# Patient Record
Sex: Male | Born: 1945 | Race: White | Hispanic: No | Smoking: Former smoker
Health system: Southern US, Community
[De-identification: ages and names within clinical notes are randomized; demographics above are authoritative.]

## PROBLEM LIST (undated history)

## (undated) DIAGNOSIS — N200 Calculus of kidney: Secondary | ICD-10-CM

## (undated) DIAGNOSIS — E0590 Thyrotoxicosis, unspecified without thyrotoxic crisis or storm: Secondary | ICD-10-CM

## (undated) DIAGNOSIS — N202 Calculus of kidney with calculus of ureter: Secondary | ICD-10-CM | POA: Insufficient documentation

## (undated) DIAGNOSIS — N201 Calculus of ureter: Secondary | ICD-10-CM

## (undated) DIAGNOSIS — R351 Nocturia: Secondary | ICD-10-CM | POA: Insufficient documentation

## (undated) DIAGNOSIS — N401 Enlarged prostate with lower urinary tract symptoms: Secondary | ICD-10-CM | POA: Insufficient documentation

## (undated) DIAGNOSIS — Z9189 Other specified personal risk factors, not elsewhere classified: Secondary | ICD-10-CM

## (undated) DIAGNOSIS — N281 Cyst of kidney, acquired: Secondary | ICD-10-CM

## (undated) DIAGNOSIS — L719 Rosacea, unspecified: Secondary | ICD-10-CM

## (undated) DIAGNOSIS — G8929 Other chronic pain: Secondary | ICD-10-CM

## (undated) DIAGNOSIS — N1330 Unspecified hydronephrosis: Secondary | ICD-10-CM

## (undated) DIAGNOSIS — E785 Hyperlipidemia, unspecified: Secondary | ICD-10-CM

## (undated) DIAGNOSIS — M545 Low back pain, unspecified: Secondary | ICD-10-CM

## (undated) DIAGNOSIS — I440 Atrioventricular block, first degree: Secondary | ICD-10-CM

## (undated) DIAGNOSIS — R350 Frequency of micturition: Secondary | ICD-10-CM | POA: Insufficient documentation

## (undated) DIAGNOSIS — I10 Essential (primary) hypertension: Secondary | ICD-10-CM

## (undated) DIAGNOSIS — M1990 Unspecified osteoarthritis, unspecified site: Secondary | ICD-10-CM

## (undated) DIAGNOSIS — K5909 Other constipation: Secondary | ICD-10-CM

## (undated) DIAGNOSIS — I214 Non-ST elevation (NSTEMI) myocardial infarction: Secondary | ICD-10-CM

## (undated) HISTORY — DX: Hyperlipidemia, unspecified: E78.5

## (undated) HISTORY — DX: Essential (primary) hypertension: I10

---

## 2002-01-11 ENCOUNTER — Encounter: Payer: Self-pay | Admitting: Orthopedic Surgery

## 2002-01-11 ENCOUNTER — Encounter: Admission: RE | Admit: 2002-01-11 | Discharge: 2002-01-11 | Payer: Self-pay | Admitting: Family Medicine

## 2004-10-16 ENCOUNTER — Ambulatory Visit: Payer: Self-pay | Admitting: Internal Medicine

## 2004-10-16 ENCOUNTER — Inpatient Hospital Stay (HOSPITAL_COMMUNITY): Admission: EM | Admit: 2004-10-16 | Discharge: 2004-10-17 | Payer: Self-pay | Admitting: Emergency Medicine

## 2008-06-29 ENCOUNTER — Emergency Department (HOSPITAL_BASED_OUTPATIENT_CLINIC_OR_DEPARTMENT_OTHER): Admission: EM | Admit: 2008-06-29 | Discharge: 2008-06-29 | Payer: Self-pay | Admitting: Emergency Medicine

## 2008-08-28 ENCOUNTER — Emergency Department (HOSPITAL_BASED_OUTPATIENT_CLINIC_OR_DEPARTMENT_OTHER): Admission: EM | Admit: 2008-08-28 | Discharge: 2008-08-28 | Payer: Self-pay | Admitting: Emergency Medicine

## 2011-02-13 NOTE — Discharge Summary (Signed)
NAME:  Phillip Wong, ALTSCHULER NO.:  [id number]   MEDICAL RECORD NO.:  [id number]          PATIENT TYPE:  INP   LOCATION:  2025                         FACILITY:  [facility]   PHYSICIAN:  Darrol Jump, MD        DATE OF BIRTH:  January 04, 1946   DATE OF ADMISSION:  10/16/2004  DATE OF DISCHARGE:  10/17/2004                                 DISCHARGE SUMMARY   ATTENDING PHYSICIAN:  Duncan Dull, M.D.   RESIDENT:  Dr. Lyda Perone   DISCHARGE DIAGNOSES:  1.  Chest pain.  2.  Hypertension.  3.  Gout.  4.  History of diverticulosis.  5.  History of alcohol abuse.  6.  History of chewing tobacco.  7.  Anxiety.  8.  Hyperlipidemia.   DISCHARGE MEDICATIONS:  1.  Hydrochlorothiazide 25 mg daily.  2.  Cardura 4 mg daily.  3.  Lopressor 100 mg two times a day.  4.  Ibuprofen 800 mg q.8h.  5.  Protonix 40 mg twice daily.   DISPOSITION:  The patient was discharged home to follow up with his primary  care doctor, Dr. Clarene Duke, [facility], within one week.  At that time, he  is going to discuss his hospitalization.   PROCEDURES PERFORMED DURING HOSPITALIZATION:  Nuclear medicine myocardial  perfusion imaging demonstrated no stress-induced ischemia extending to the  apex and to the anterolateral wall.  Ejection fraction was 74%, essentially  normal exam.   BRIEF ADMISSION HISTORY/PHYSICAL:  This is a 65 year old male with a past  medical history significant for hypertension and hyperlipidemia, family  history of coronary artery disease, obesity, who came to the ED with  increasing chest pain over the past week and markedly increased the morning  of admission at 4 a.m. with no associated diaphoresis, nausea, or vomiting.  It did not seem to be exertional.   PHYSICAL EXAMINATION ON ADMISSION:  VITAL SIGNS:  Pulse 87, blood pressure  159/93, temperature 98, respiratory rate 19, saturation 96% on 2 L.  GENERAL:  He was awake and alert, oriented x3.   HEENT:  PERLA, no icterus.   NECK:   No JVD, no thyromegaly.   LUNGS:  Respirations are clear to auscultation bilaterally.   CARDIOVASCULAR:  Regular rate and rhythm with no murmurs, rubs or gallops.   ABDOMEN:  Soft, nontender, nondistended, positive bowel sounds, no  hepatosplenomegaly.   EXTREMITIES:  No cyanosis, clubbing or edema.   NEUROLOGICAL:  Grossly intact.   MUSCULOSKELETAL:  Grossly intact.   PSYCHIATRIC:  The patient appeared really anxious.   LABORATORY DATA:  D-dimer was less than 0.22.  PTT 26.  Point-of-care  markers were negative.  Sodium 138, potassium 4.2, chloride 100, bicarbonate  29, BUN 10, creatinine 1.0, glucose 152, bilirubin 0.8, alkaline phosphatase  81, SGOT 40, SGPT 40, protein was 7, albumin 3.5, calcium 9.2.  White blood  cell 6.4, hemoglobin 14.9, platelets 226,000.  Chest x-ray showed normal x-  ray, no acute abnormality.   HOSPITAL COURSE:  Problem 1.  Chest pain.  The patient was admitted to a  telemetry bed and was started  on heparin drip, nitrates, and beta blocker.  He was given aspirin.  Cardiac enzymes were cycled q.8h. and were negative.  The patient had a nuclear imaging study to rule out ischemia of the  myocardium, which was negative.  The patient was discharged home to follow  up with his primary doctor.  The patient had some diffuse EKG abnormalities  on admission.  He had T wave inversions in V1, V2, V3, as well as aVL and 1.   Problem 2. Hyperlipidemia.  The patient's cholesterol was mildly elevated.  Total cholesterol was 193, triglycerides 170, HDL 47, LDL 112.  The patient  is to discuss the possibility of starting a statin drug with his primary  care physician upon discharge.   Problem 3. Anxiety.  The patient appeared very anxious on admission.  He  states that at home, he has a lot of anxiety.  He has lots of thoughts  racing through his mind.  He has difficulty sleeping at night.  It was  discussed with the patient to bring this up with his primary care  physician  and discuss this with him in the future.   Problem 4. Alcohol abuse.  The patient was started on an Ativan taper,  thiamine, folic acid, and seizure precautions while he was here, and was  discharged home on no Ativan taper, but counseled about the effects of  drinking.  He was not interested in any intervention at this time.   Problem 5. Tobacco abuse.  The patient received tobacco cessation counseling  on this admission, but denied wanting a patch.   LABORATORIES ON DISCHARGE:  White blood cell count 7.2, hemoglobin 14.2,  platelets 203,000.                                               ______________________________  Darrol Jump, MD    SD/MEDQ  D:  10/20/2004  T:  10/20/2004  Job:  [id number]   cc:   Eduardo Osier. Sharyn Lull, M.D.  [address]  [place]  [place] [address]  Fax: [phone]   Anna Genre. Little, M.D.  [address]  [place]  [place] [address]  Fax: [phone]

## 2011-06-30 LAB — URINALYSIS, ROUTINE W REFLEX MICROSCOPIC
Bilirubin Urine: NEGATIVE
Nitrite: NEGATIVE
Specific Gravity, Urine: 1.029
Urobilinogen, UA: 0.2

## 2011-06-30 LAB — URINE MICROSCOPIC-ADD ON

## 2011-06-30 LAB — URINE CULTURE
Colony Count: NO GROWTH
Culture: NO GROWTH

## 2011-09-29 HISTORY — PX: VIDEO ASSISTED THORACOSCOPY (VATS)/DECORTICATION: SHX6171

## 2012-01-28 ENCOUNTER — Ambulatory Visit
Admission: RE | Admit: 2012-01-28 | Discharge: 2012-01-28 | Disposition: A | Discharge status: Home / Self Care | Payer: Medicare Other | Source: Ambulatory Visit | Attending: Cardiology | Admitting: Cardiology

## 2012-01-28 ENCOUNTER — Other Ambulatory Visit: Payer: Self-pay | Admitting: Cardiology

## 2012-01-28 DIAGNOSIS — M79669 Pain in unspecified lower leg: Secondary | ICD-10-CM

## 2012-09-30 ENCOUNTER — Encounter (INDEPENDENT_AMBULATORY_CARE_PROVIDER_SITE_OTHER): Payer: Self-pay | Admitting: Surgery

## 2012-09-30 ENCOUNTER — Ambulatory Visit (INDEPENDENT_AMBULATORY_CARE_PROVIDER_SITE_OTHER): Payer: Medicare Other | Admitting: Surgery

## 2012-09-30 VITALS — BP 118/80 | HR 72 | Temp 97.8°F | Resp 18 | Ht 70.0 in | Wt 204.2 lb

## 2012-09-30 DIAGNOSIS — K429 Umbilical hernia without obstruction or gangrene: Secondary | ICD-10-CM

## 2012-09-30 NOTE — Patient Instructions (Addendum)
Hernia A hernia occurs when an internal organ pushes out through a weak spot in the abdominal wall. Hernias most commonly occur in the groin and around the navel. Hernias often can be pushed back into place (reduced). Most hernias tend to get worse over time. Some abdominal hernias can get stuck in the opening (irreducible or incarcerated hernia) and cannot be reduced. An irreducible abdominal hernia which is tightly squeezed into the opening is at risk for impaired blood supply (strangulated hernia). A strangulated hernia is a medical emergency. Because of the risk for an irreducible or strangulated hernia, surgery may be recommended to repair a hernia. CAUSES   Heavy lifting.  Prolonged coughing.  Straining to have a bowel movement.  A cut (incision) made during an abdominal surgery. HOME CARE INSTRUCTIONS   Bed rest is not required. You may continue your normal activities.  Avoid lifting more than 10 pounds (4.5 kg) or straining.  Cough gently. If you are a smoker it is best to stop. Even the best hernia repair can break down with the continual strain of coughing. Even if you do not have your hernia repaired, a cough will continue to aggravate the problem.  Do not wear anything tight over your hernia. Do not try to keep it in with an outside bandage or truss. These can damage abdominal contents if they are trapped within the hernia sac.  Eat a normal diet.  Avoid constipation. Straining over long periods of time will increase hernia size and encourage breakdown of repairs. If you cannot do this with diet alone, stool softeners may be used. SEEK IMMEDIATE MEDICAL CARE IF:   You have a fever.  You develop increasing abdominal pain.  You feel nauseous or vomit.  Your hernia is stuck outside the abdomen, looks discolored, feels hard, or is tender.  You have any changes in your bowel habits or in the hernia that are unusual for you.  You have increased pain or swelling around the  hernia.  You cannot push the hernia back in place by applying gentle pressure while lying down. MAKE SURE YOU:   Understand these instructions.  Will watch your condition.  Will get help right away if you are not doing well or get worse. Document Released: 09/14/2005 Document Revised: 12/07/2011 Document Reviewed: 05/03/2008 Exit[employer] Patient Information 2013 [employer].  

## 2012-09-30 NOTE — Progress Notes (Signed)
Phillip Wong 67 y.o.  Body mass index is 29.30 kg/(m^2).  Patient Active Problem List  Diagnosis  . Umbilical hernia-small supraumbilical    No Known Allergies  Past Surgical History  Procedure Date  . Lung surgery     for Signa Kell, MD 1. Umbilical hernia-small supraumbilical     [name] had an episode of a sore stomach and was seen by Dr. Clarene Duke who thought that he may have had an incarcerated umbilical hernia. It sounds like he probably has a supraumbilical hernia that was incarcerated with preperitoneal fat. This has been reduced and is not bothering him at the present time. I think he feels like it is not broken don't fix it. I would agree and I told him what to look for in terms of nausea and vomiting should this become more of a problem or get larger. I would be happy to see him again if needed in the future.  Impression small supraumbilical umbilical hernia that is not posing any immediate threat and likely contains preperitoneal fat. Return when necessary Matt B. Daphine Deutscher, MD, [facility] [phone] beeper [phone]  09/30/2012 10:05 AM

## 2013-03-05 ENCOUNTER — Emergency Department (HOSPITAL_COMMUNITY): Payer: Medicare Other

## 2013-03-05 ENCOUNTER — Encounter (HOSPITAL_COMMUNITY): Payer: Self-pay | Admitting: Emergency Medicine

## 2013-03-05 ENCOUNTER — Emergency Department (HOSPITAL_COMMUNITY)
Admission: EM | Admit: 2013-03-05 | Discharge: 2013-03-05 | Disposition: A | Discharge status: Home / Self Care | Payer: Medicare Other | Attending: Emergency Medicine | Admitting: Emergency Medicine

## 2013-03-05 DIAGNOSIS — E785 Hyperlipidemia, unspecified: Secondary | ICD-10-CM | POA: Insufficient documentation

## 2013-03-05 DIAGNOSIS — K5900 Constipation, unspecified: Secondary | ICD-10-CM | POA: Insufficient documentation

## 2013-03-05 DIAGNOSIS — Z87891 Personal history of nicotine dependence: Secondary | ICD-10-CM | POA: Insufficient documentation

## 2013-03-05 DIAGNOSIS — Z8701 Personal history of pneumonia (recurrent): Secondary | ICD-10-CM | POA: Insufficient documentation

## 2013-03-05 DIAGNOSIS — R109 Unspecified abdominal pain: Secondary | ICD-10-CM

## 2013-03-05 DIAGNOSIS — I10 Essential (primary) hypertension: Secondary | ICD-10-CM | POA: Insufficient documentation

## 2013-03-05 DIAGNOSIS — Z79899 Other long term (current) drug therapy: Secondary | ICD-10-CM | POA: Insufficient documentation

## 2013-03-05 LAB — COMPREHENSIVE METABOLIC PANEL
ALT: 20 U/L (ref 0–53)
AST: 31 U/L (ref 0–37)
CO2: 25 mEq/L (ref 19–32)
Calcium: 9.7 mg/dL (ref 8.4–10.5)
Sodium: 135 mEq/L (ref 135–145)
Total Protein: 8.2 g/dL (ref 6.0–8.3)

## 2013-03-05 LAB — CBC WITH DIFFERENTIAL/PLATELET
Basophils Absolute: 0.1 10*3/uL (ref 0.0–0.1)
Eosinophils Absolute: 0.1 10*3/uL (ref 0.0–0.7)
Eosinophils Relative: 1 % (ref 0–5)
Lymphocytes Relative: 38 % (ref 12–46)
MCV: 89 fL (ref 78.0–100.0)
Platelets: 208 10*3/uL (ref 150–400)
RDW: 13.2 % (ref 11.5–15.5)
WBC: 7.7 10*3/uL (ref 4.0–10.5)

## 2013-03-05 LAB — URINALYSIS, ROUTINE W REFLEX MICROSCOPIC
Bilirubin Urine: NEGATIVE
Glucose, UA: NEGATIVE mg/dL
Specific Gravity, Urine: 1.029 (ref 1.005–1.030)
pH: 5.5 (ref 5.0–8.0)

## 2013-03-05 LAB — URINE MICROSCOPIC-ADD ON

## 2013-03-05 MED ORDER — IOHEXOL 300 MG/ML  SOLN
100.0000 mL | Freq: Once | INTRAMUSCULAR | Status: AC | PRN
  Administered 2013-03-05: 100 mL via INTRAVENOUS

## 2013-03-05 MED ORDER — IOHEXOL 300 MG/ML  SOLN
50.0000 mL | Freq: Once | INTRAMUSCULAR | Status: AC | PRN
  Administered 2013-03-05: 50 mL via ORAL

## 2013-03-05 NOTE — ED Notes (Signed)
Patient presents to ED today with complaints of right middle abdominal pain for the past month.

## 2013-03-05 NOTE — ED Provider Notes (Signed)
History     CSN: [id number]  Arrival date & time 03/05/13  1308   First MD Initiated Contact with Patient 03/05/13 1924      Chief Complaint  Patient presents with  . Abdominal Pain    (Consider location/radiation/quality/duration/timing/severity/associated sxs/prior treatment) Patient is a 67 y.o. male presenting with abdominal pain. The history is provided by the patient.  Abdominal Pain This is a new problem. The current episode started more than 1 month ago (2 months ago). The problem occurs intermittently. The problem has been gradually worsening. Associated symptoms include abdominal pain. Pertinent negatives include no chest pain, chills, congestion, coughing, fever, headaches, nausea, vomiting or weakness. Nothing aggravates the symptoms. Treatments tried: stool softener, medicine 'for gas" from PCP. The treatment provided no relief.    Past Medical History  Diagnosis Date  . Hypertension   . Hyperlipidemia   . History of pneumonia     Past Surgical History  Procedure Laterality Date  . Lung surgery      for pnuemonia    Family History  Problem Relation Age of Onset  . Heart disease Mother     History  Substance Use Topics  . Smoking status: Former Smoker    Quit date: 09/28/1998  . Smokeless tobacco: Not on file  . Alcohol Use: Yes      Review of Systems  Constitutional: Negative for fever and chills.  HENT: Negative for congestion and rhinorrhea.   Respiratory: Negative for cough, chest tightness and shortness of breath.   Cardiovascular: Negative for chest pain.  Gastrointestinal: Positive for abdominal pain. Negative for nausea, vomiting, diarrhea, constipation, blood in stool and abdominal distention.  Genitourinary: Negative for dysuria.  Musculoskeletal: Negative for back pain.  Neurological: Negative for dizziness, weakness and headaches.  All other systems reviewed and are negative.    Allergies  Review of patient's allergies indicates no  known allergies.  Home Medications   Current Outpatient Rx  Name  Route  Sig  Dispense  Refill  . amLODipine (NORVASC) 5 MG tablet   Oral   Take 5 mg by mouth daily.          . carvedilol (COREG) 12.5 MG tablet      daily.         . cyclobenzaprine (FLEXERIL) 10 MG tablet   Oral   Take 10 mg by mouth daily.         . finasteride (PROSCAR) 5 MG tablet   Oral   Take 5 mg by mouth daily.          . hydrochlorothiazide (HYDRODIURIL) 25 MG tablet   Oral   Take 25 mg by mouth daily.          Marland Kitchen HYDROcodone-acetaminophen (NORCO) 7.5-325 MG per tablet   Oral   Take 1 tablet by mouth every 6 (six) hours as needed for pain.          Marland Kitchen lisinopril (PRINIVIL,ZESTRIL) 10 MG tablet   Oral   Take 10 mg by mouth daily.          . Melatonin 5 MG TABS   Oral   Take 1 tablet by mouth at bedtime.         . polyethylene glycol (MIRALAX / GLYCOLAX) packet   Oral   Take 17 g by mouth daily.         . pravastatin (PRAVACHOL) 40 MG tablet   Oral   Take 40 mg by mouth daily.          Marland Kitchen  propylthiouracil (PTU) 50 MG tablet   Oral   Take 50 mg by mouth 2 (two) times daily.          . temazepam (RESTORIL) 30 MG capsule   Oral   Take 30 mg by mouth at bedtime as needed for sleep.            BP 146/77  Pulse 82  Temp(Src) 97.9 F (36.6 C) (Oral)  Resp 17  Ht 5\' 10"  (1.778 m)  Wt 198 lb (89.812 kg)  BMI 28.41 kg/m2  SpO2 97%  Physical Exam  Nursing note and vitals reviewed. Constitutional: He is oriented to person, place, and time. He appears well-developed and well-nourished. No distress.  HENT:  Head: Normocephalic and atraumatic.  Right Ear: External ear normal.  Left Ear: External ear normal.  Mouth/Throat: Oropharynx is clear and moist.  Eyes: Pupils are equal, round, and reactive to light.  Neck: Normal range of motion. Neck supple.  Cardiovascular: Normal rate, regular rhythm, normal heart sounds and intact distal pulses.  Exam reveals no gallop  and no friction rub.   No murmur heard. Pulmonary/Chest: Effort normal and breath sounds normal. No respiratory distress. He has no wheezes. He has no rales.  Abdominal: Soft. There is tenderness (mild TTP RLQ). There is no rebound and no guarding.  No evidence of inguinal or femoral hernia. No CVA TTP  Musculoskeletal: Normal range of motion. He exhibits no edema and no tenderness.  Lymphadenopathy:    He has no cervical adenopathy.  Neurological: He is alert and oriented to person, place, and time.  Skin: Skin is warm and dry. No rash noted. No erythema.  Psychiatric: He has a normal mood and affect. His behavior is normal.    ED Course  Procedures (including critical care time)  Labs Reviewed  CBC WITH DIFFERENTIAL - Abnormal; Notable for the following:    MCHC 37.4 (*)    All other components within normal limits  COMPREHENSIVE METABOLIC PANEL - Abnormal; Notable for the following:    Glucose, Bld 115 (*)    Alkaline Phosphatase 142 (*)    All other components within normal limits  URINALYSIS, ROUTINE W REFLEX MICROSCOPIC - Abnormal; Notable for the following:    APPearance CLOUDY (*)    Hgb urine dipstick LARGE (*)    Protein, ur 100 (*)    Leukocytes, UA SMALL (*)    All other components within normal limits  URINE MICROSCOPIC-ADD ON   Ct Abdomen Pelvis W Contrast  03/05/2013   *RADIOLOGY REPORT*  Clinical Data: Right-sided abdominal pain for 2 months.  Pain and constipation.  CT ABDOMEN AND PELVIS WITH CONTRAST  Technique:  Multidetector CT imaging of the abdomen and pelvis was performed following the standard protocol during bolus administration of intravenous contrast.  Contrast: OMNIPAQUE IOHEXOL 300 MG/ML  SOLN  Comparison: 05/06/2011  Findings: Fibrosis and atelectasis in the lung bases.  Changes consistent with chronic lung disease.  Coronary artery calcifications.  The gallbladder is contracted to a which may be physiologic.  No stones or inflammatory infiltration  demonstrated. Large bilateral stones in the renal pelvis, the right measuring 1.2 x 2.2 cm and the left measuring 1.3 x 0.8 cm.  These changes appear stable since previous study.  No evidence of pyelocaliectasis or ureterectasis. No ureteral stones or bladder stones identified.  Symmetrical renal nephrograms.  The liver, spleen, pancreas, adrenal glands, inferior vena cava, and retroperitoneal lymph nodes are unremarkable.  Calcification of the aorta without aneurysm.  Scattered celiac axis lymph nodes are not pathologically enlarged.  The stomach, small bowel, and colon are not abnormally distended and no wall thickening is identified. Stool fills the colon.  No free air or free fluid in the abdomen. Abdominal wall appears intact.  Pelvis:  Calcification in the prostate without significant enlargement.  Bladder wall is not thickened.  No free or loculated pelvic fluid collections.  No significant pelvic lymphadenopathy. No evidence of diverticulitis.  The appendix is not identified. Degenerative changes in the lumbar spine.  No destructive bone lesions appreciated.  IMPRESSION: Stable appearance of large bilateral stones in the renal pelvis. Nonspecific contraction of the gallbladder may be physiologic.  No acute inflammatory process demonstrated in the abdomen or pelvis.   Original Report Authenticated By: Burman Nieves, M.D.     1. Abdominal pain   2. Constipation       MDM  82:50 PM 67 year old male with history of hypertension hyperlipidemia presenting with 2 months of intermittent abdominal pain this gradually worsened. Patient has seen his PCP twice, first time being given stool softener and second time being given "something for gas". He denies any improvement in symptoms of his medicines. He denies nausea, vomiting, fever, chest pain or shortness of breath. He denies constipation or change in caliber of stools. No bloody stools. Denies ever having colonoscopy in the past. Abdomen mildly  tenderness right lower quadrant but no guarding or rebound. No evidence of hernias on exam. Vitals are stable. Overall comfortable. We'll check labs and given a CT scan abdomen and pelvis.  10:22 PM labs with no significant abnormality. CT neg for acute process but does show stool burden. Discussed with patient. Stool could be contributing. He did recently decrease his dose of miralax so told to increase back up. He will f/u with his PCP for re-eval and if still having symptoms discuss referral to GI. He voiced understanding, given return precautions and dc'd home in stable condition.       Caren Hazy, MD 03/05/13 [id number]

## 2013-03-05 NOTE — ED Provider Notes (Signed)
I saw and evaluated the patient, reviewed the resident's note and I agree with the findings and plan.   Lavi Sheehan D Mehul Rudin, MD 03/05/13 2347 

## 2013-03-05 NOTE — ED Notes (Signed)
Pt states understanding of discharge instructions 

## 2013-03-05 NOTE — ED Notes (Signed)
Johnnell MD at bedside. 

## 2013-03-05 NOTE — ED Provider Notes (Signed)
67 year old male with approximately 2 months of intermittent but more frequently occurring right lower quadrant abdominal pain. He has been seen by his family Dr. twice, prescribed MiraLAX to help with hard stools but this has not seemed to relieve his pain. On exam the patient is in no distress, he is distracted by the television, soft and nontender abdomen with no guarding, no masses, no peritoneal signs. Heart and lungs appear clear, no significant peripheral edema, oropharynx is clear and moist and the patient has been able to tolerate a normal diet.  Laboratory workup to ensue, the patient would benefit from further imaging as he may have a slow-growing or indolent process that needs attention such as a tumor or a mass. Other than that the patient is hemodynamically stable and should be safe for discharge CT scan pending.  I agree with the documentation and medical decision-making of Dr. Fayrene Fearing.  Vida Roller, MD 03/05/13 2025

## 2013-05-03 ENCOUNTER — Other Ambulatory Visit: Payer: Self-pay

## 2013-08-03 ENCOUNTER — Other Ambulatory Visit: Payer: Self-pay

## 2014-06-21 ENCOUNTER — Emergency Department (HOSPITAL_COMMUNITY)
Admission: EM | Admit: 2014-06-21 | Discharge: 2014-06-21 | Disposition: A | Discharge status: Home / Self Care | Payer: Medicare Other | Attending: Emergency Medicine | Admitting: Emergency Medicine

## 2014-06-21 ENCOUNTER — Emergency Department (HOSPITAL_COMMUNITY): Payer: Medicare Other

## 2014-06-21 ENCOUNTER — Encounter (HOSPITAL_COMMUNITY): Payer: Self-pay | Admitting: Emergency Medicine

## 2014-06-21 DIAGNOSIS — R109 Unspecified abdominal pain: Secondary | ICD-10-CM | POA: Diagnosis present

## 2014-06-21 DIAGNOSIS — E785 Hyperlipidemia, unspecified: Secondary | ICD-10-CM | POA: Diagnosis not present

## 2014-06-21 DIAGNOSIS — R1031 Right lower quadrant pain: Secondary | ICD-10-CM | POA: Insufficient documentation

## 2014-06-21 DIAGNOSIS — Z8701 Personal history of pneumonia (recurrent): Secondary | ICD-10-CM | POA: Diagnosis not present

## 2014-06-21 DIAGNOSIS — Z79899 Other long term (current) drug therapy: Secondary | ICD-10-CM | POA: Insufficient documentation

## 2014-06-21 DIAGNOSIS — Z87891 Personal history of nicotine dependence: Secondary | ICD-10-CM | POA: Diagnosis not present

## 2014-06-21 DIAGNOSIS — K6289 Other specified diseases of anus and rectum: Secondary | ICD-10-CM | POA: Insufficient documentation

## 2014-06-21 DIAGNOSIS — I10 Essential (primary) hypertension: Secondary | ICD-10-CM | POA: Insufficient documentation

## 2014-06-21 DIAGNOSIS — K648 Other hemorrhoids: Secondary | ICD-10-CM | POA: Insufficient documentation

## 2014-06-21 DIAGNOSIS — R1011 Right upper quadrant pain: Secondary | ICD-10-CM | POA: Insufficient documentation

## 2014-06-21 LAB — COMPREHENSIVE METABOLIC PANEL
ALT: 22 U/L (ref 0–53)
AST: 21 U/L (ref 0–37)
Albumin: 4.1 g/dL (ref 3.5–5.2)
Alkaline Phosphatase: 81 U/L (ref 39–117)
Anion gap: 14 (ref 5–15)
BUN: 16 mg/dL (ref 6–23)
CALCIUM: 9.8 mg/dL (ref 8.4–10.5)
CO2: 24 meq/L (ref 19–32)
CREATININE: 0.6 mg/dL (ref 0.50–1.35)
Chloride: 99 mEq/L (ref 96–112)
GLUCOSE: 137 mg/dL — AB (ref 70–99)
Potassium: 3.8 mEq/L (ref 3.7–5.3)
Sodium: 137 mEq/L (ref 137–147)
Total Bilirubin: 1.1 mg/dL (ref 0.3–1.2)
Total Protein: 8 g/dL (ref 6.0–8.3)

## 2014-06-21 LAB — CBC WITH DIFFERENTIAL/PLATELET
Basophils Absolute: 0 10*3/uL (ref 0.0–0.1)
Basophils Relative: 1 % (ref 0–1)
EOS ABS: 0 10*3/uL (ref 0.0–0.7)
Eosinophils Relative: 0 % (ref 0–5)
HCT: 43.4 % (ref 39.0–52.0)
Hemoglobin: 15.2 g/dL (ref 13.0–17.0)
LYMPHS ABS: 1.9 10*3/uL (ref 0.7–4.0)
LYMPHS PCT: 27 % (ref 12–46)
MCH: 31.6 pg (ref 26.0–34.0)
MCHC: 35 g/dL (ref 30.0–36.0)
MCV: 90.2 fL (ref 78.0–100.0)
Monocytes Absolute: 0.3 10*3/uL (ref 0.1–1.0)
Monocytes Relative: 4 % (ref 3–12)
NEUTROS ABS: 4.8 10*3/uL (ref 1.7–7.7)
NEUTROS PCT: 68 % (ref 43–77)
PLATELETS: 223 10*3/uL (ref 150–400)
RBC: 4.81 MIL/uL (ref 4.22–5.81)
RDW: 13.2 % (ref 11.5–15.5)
WBC: 7 10*3/uL (ref 4.0–10.5)

## 2014-06-21 LAB — URINALYSIS, ROUTINE W REFLEX MICROSCOPIC
Bilirubin Urine: NEGATIVE
GLUCOSE, UA: NEGATIVE mg/dL
Ketones, ur: NEGATIVE mg/dL
Nitrite: NEGATIVE
PH: 5 (ref 5.0–8.0)
PROTEIN: 30 mg/dL — AB
SPECIFIC GRAVITY, URINE: 1.025 (ref 1.005–1.030)
Urobilinogen, UA: 0.2 mg/dL (ref 0.0–1.0)

## 2014-06-21 LAB — URINE MICROSCOPIC-ADD ON

## 2014-06-21 LAB — POC OCCULT BLOOD, ED: Fecal Occult Bld: NEGATIVE

## 2014-06-21 MED ORDER — IOHEXOL 300 MG/ML  SOLN
100.0000 mL | Freq: Once | INTRAMUSCULAR | Status: AC | PRN
  Administered 2014-06-21: 100 mL via INTRAVENOUS

## 2014-06-21 MED ORDER — HYDROCORTISONE ACETATE 25 MG RE SUPP: 25.0000 mg | Freq: Two times a day (BID) | RECTAL | Status: DC

## 2014-06-21 MED ORDER — IOHEXOL 300 MG/ML  SOLN: 25.0000 mL | Freq: Once | INTRAMUSCULAR | Status: DC | PRN

## 2014-06-21 MED ORDER — SODIUM CHLORIDE 0.9 % IV BOLUS (SEPSIS)
1000.0000 mL | Freq: Once | INTRAVENOUS | Status: AC
  Administered 2014-06-21: 1000 mL via INTRAVENOUS

## 2014-06-21 MED ORDER — MORPHINE SULFATE 4 MG/ML IJ SOLN: 4.0000 mg | Freq: Once | INTRAMUSCULAR | Status: DC

## 2014-06-21 NOTE — ED Provider Notes (Signed)
CSN: [id number]     Arrival date & time 06/21/14  0957 History   First MD Initiated Contact with Patient 06/21/14 1631     Chief Complaint  Patient presents with  . Abdominal Pain  . Rectal Pain     (Consider location/radiation/quality/duration/timing/severity/associated sxs/prior Treatment) HPI Phillip Wong is a 68 year old male with past medical history of hypertension, hyperlipidemia who presents to the ER with abdominal pain for 3 weeks. Patient states his pain began gradually approximately 3 weeks ago, has been constant, his only alleviated with pain medicine. Patient states he noticed some mild aggravation of it when he is reaching out work with a heavy object such as hedge clippers when doing yard work. Patient reports a burning sensation in his rectum after he passes a bowel movement which is consistent with previous hemorrhoids and states that his stools have been consistently loose, however he takes MiraLAX daily. Patient denies any associated nausea, vomiting, lightheadedness, dizziness, weakness, shortness of breath, chest pain, hematochezia, melena, dysuria. Patient went to see his primary care physician on 06/06/2014 for same. Patient's PCP took blood work at that time, and noted patient's TSH to be low. Patient states since that time, his pain has been constant, and has only been alleviated with hydrocodone. Patient states he was seen in the ER for similar complaints last year and 02/2013. Patient had a CT abdomen pelvis and 02/2013 which was only remarkable for some large stones and patient's renal pelvis bilaterally. Patient states last month he also noted a 9 pound weight loss which is also what his PCP was working him up for on 06/06/14.  Past Medical History  Diagnosis Date  . Hypertension   . Hyperlipidemia   . History of pneumonia    Past Surgical History  Procedure Laterality Date  . Lung surgery      for pnuemonia   Family History  Problem Relation Age of Onset  . Heart  disease Mother    History  Substance Use Topics  . Smoking status: Former Smoker    Quit date: 09/28/1998  . Smokeless tobacco: Not on file  . Alcohol Use: Yes    Review of Systems  Constitutional: Negative for fever and fatigue.  HENT: Negative for trouble swallowing.   Eyes: Negative for visual disturbance.  Respiratory: Negative for shortness of breath.   Cardiovascular: Negative for chest pain.  Gastrointestinal: Positive for abdominal pain. Negative for nausea and vomiting.  Genitourinary: Negative for dysuria.  Musculoskeletal: Negative for neck pain.  Skin: Negative for rash.  Neurological: Negative for dizziness, weakness and numbness.  Psychiatric/Behavioral: Negative.       Allergies  Review of patient's allergies indicates no known allergies.  Home Medications   Prior to Admission medications   Medication Sig Start Date End Date Taking? Authorizing Provider  allopurinol (ZYLOPRIM) 300 MG tablet Take 300 mg by mouth daily.   Yes Historical Provider, MD  amLODipine (NORVASC) 5 MG tablet Take 5 mg by mouth daily.  09/26/12  Yes Historical Provider, MD  carvedilol (COREG) 12.5 MG tablet Take 12.5 mg by mouth 2 (two) times daily with a meal.  08/29/12  Yes Historical Provider, MD  finasteride (PROSCAR) 5 MG tablet Take 5 mg by mouth daily.  09/15/12  Yes Historical Provider, MD  hydrochlorothiazide (HYDRODIURIL) 25 MG tablet Take 25 mg by mouth daily.  09/26/12  Yes Historical Provider, MD  HYDROcodone-acetaminophen (NORCO) 7.5-325 MG per tablet Take 1 tablet by mouth every 6 (six) hours as needed for pain.  08/18/12  Yes Historical Provider, MD  lisinopril (PRINIVIL,ZESTRIL) 10 MG tablet Take 10 mg by mouth daily.  09/26/12  Yes Historical Provider, MD  polyethylene glycol (MIRALAX / GLYCOLAX) packet Take 17 g by mouth daily.   Yes Historical Provider, MD  pravastatin (PRAVACHOL) 40 MG tablet Take 40 mg by mouth daily.  08/29/12  Yes Historical Provider, MD   propylthiouracil (PTU) 50 MG tablet Take 50 mg by mouth 2 (two) times daily.  09/15/12  Yes Historical Provider, MD  temazepam (RESTORIL) 30 MG capsule Take 30 mg by mouth at bedtime as needed for sleep.  09/14/12  Yes Historical Provider, MD  hydrocortisone (ANUSOL-HC) 25 MG suppository Place 1 suppository (25 mg total) rectally 2 (two) times daily. 06/21/14   Monte Fantasia, PA-C   BP 120/86  Pulse 78  Temp(Src) 98.1 F (36.7 C) (Oral)  Resp 16  SpO2 98% Physical Exam  Nursing note and vitals reviewed. Constitutional: He is oriented to person, place, and time. He appears well-developed and well-nourished. No distress.  HENT:  Head: Normocephalic and atraumatic.  Mouth/Throat: Oropharynx is clear and moist. No oropharyngeal exudate.  Eyes: Right eye exhibits no discharge. Left eye exhibits no discharge. No scleral icterus.  Neck: Normal range of motion.  Cardiovascular: Normal rate, regular rhythm and normal heart sounds.   No murmur heard. Pulmonary/Chest: Effort normal and breath sounds normal. No respiratory distress.  Abdominal: Soft. Normal appearance and bowel sounds are normal. There is no tenderness. Hernia confirmed negative in the right inguinal area and confirmed negative in the left inguinal area.  Patient references his pain in the right side of his abdomen diffusely. No point tenderness, abdomen soft, nontender.  Genitourinary: Prostate normal, testes normal and penis normal. Rectal exam shows internal hemorrhoid. Rectal exam shows no external hemorrhoid, no fissure, no mass, no tenderness and anal tone normal. Guaiac negative stool. Prostate is not enlarged and not tender. Cremasteric reflex is present. Right testis shows no mass, no swelling and no tenderness. Right testis is descended. Cremasteric reflex is not absent on the right side. Left testis shows no mass, no swelling and no tenderness. Left testis is descended. Cremasteric reflex is not absent on the left side.  Circumcised. No phimosis, paraphimosis, hypospadias, penile erythema or penile tenderness. No discharge found.  Internal hemorrhoid noted at 3:00 position. Mild discomfort noted during rectal exam. Chaperone present during entire genital exam.  Musculoskeletal: Normal range of motion. He exhibits no edema and no tenderness.  Lymphadenopathy:       Right: No inguinal adenopathy present.       Left: No inguinal adenopathy present.  Neurological: He is alert and oriented to person, place, and time. No cranial nerve deficit. Coordination normal.  Skin: Skin is warm and dry. No rash noted. He is not diaphoretic.  Psychiatric: He has a normal mood and affect.    ED Course  Procedures (including critical care time) Labs Review Labs Reviewed  COMPREHENSIVE METABOLIC PANEL - Abnormal; Notable for the following:    Glucose, Bld 137 (*)    All other components within normal limits  URINALYSIS, ROUTINE W REFLEX MICROSCOPIC - Abnormal; Notable for the following:    APPearance CLOUDY (*)    Hgb urine dipstick LARGE (*)    Protein, ur 30 (*)    Leukocytes, UA SMALL (*)    All other components within normal limits  CBC WITH DIFFERENTIAL  URINE MICROSCOPIC-ADD ON  POC OCCULT BLOOD, ED    Imaging Review Ct  Abdomen Pelvis W Contrast  06/21/2014   CLINICAL DATA:  Right-sided abdominal pain.  EXAM: CT ABDOMEN AND PELVIS WITH CONTRAST  TECHNIQUE: Multidetector CT imaging of the abdomen and pelvis was performed using the standard protocol following bolus administration of intravenous contrast.  CONTRAST:  OMNIPAQUE IOHEXOL 300 MG/ML  SOLN  COMPARISON:  CT 03/05/2013  FINDINGS: Visualization of the lower thorax demonstrates dependent atelectasis. No pleural effusion. Normal heart size. Fracture vascular calcifications.  Liver is normal in size and contour. Regional hypoattenuation within the posterior right hepatic lobe is favored to represent fatty deposition. There is an 8 mm flash filling lesion  within the right hepatic lobe favored to represent a small hemangioma. The portal vein is patent. No intrahepatic or extrahepatic biliary ductal dilatation. The spleen, pancreas and bilateral adrenal glands are unremarkable. There is a small duodenal diverticulum.  The kidneys enhance symmetrically with contrast. Re- demonstrated 2.1 cm stone within the right renal collecting system. Re- demonstrated 1.4 cm stone within the left renal collecting system. Simple cysts within the interpolar region of the left kidney.  Normal caliber abdominal aorta with scattered calcified atherosclerotic plaque. No retroperitoneal lymphadenopathy. Central dystrophic calcifications in the prostate. Small fat containing right inguinal hernia.  Normal appendix. No abnormal bowel wall thickening or evidence for bowel obstruction. No free fluid or free intraperitoneal air. Lower lumbar spine degenerative changes. No aggressive or acute appearing osseous lesions.  IMPRESSION: 1. Re- demonstrated large bilateral stones within the left and right renal pelvis. 2. No acute process within the abdomen or pelvis.   Electronically Signed   By: Annia Belt M.D.   On: 06/21/2014 19:59     EKG Interpretation None      MDM   Final diagnoses:  Right upper quadrant pain  Right lower quadrant abdominal pain   67 year old male with 3 weeks of right-sided abdominal pain. Patient seen by PCP for abdominal pain and weight loss, and noted to have low TSH, managed in the office. Patient stating his pain has been consistent, and is not getting any better. Patient states he has never had a pain similar to this in the past. Patient states his pain is worse with sitting up, alleviated by standing up, or lying down. Patient states he was recently trimming hedges, with an electric hedge clipper, after which he noticed the pain was worse that evening. Workup for rule out of acute abdomen with CBC, UA, CMP, CT abdomen pelvis. Patient also complaining of  "burning sensation" while defecating. Patient noted to have an internal hemorrhoid on rectal exam. Fecal occult blood negative.   Patient is nontoxic, nonseptic appearing, in no apparent distress. No leukocytosis, or abnormal electrolyte levels. Liver function within normal limits. UA remarkable for hematuria. Patient states this is baseline for him, he is seeing urology for same, and it is an ongoing issue, however urologist states it is stable.  Patient's pain and other symptoms adequately managed in emergency department.  Fluid bolus given.  Labs, imaging and vitals reviewed.  Patient does not meet the SIRS or Sepsis criteria.  On repeat exam patient does not have a surgical abdomin and there are no peritoneal signs.  No indication of appendicitis, bowel obstruction, bowel perforation, cholecystitis, diverticulitis.  Patient discharged home with symptomatic treatment and given strict instructions for follow-up with their primary care physician. I also gave patient Anusol suppository to help his hemorrhoid pain.  I have also discussed reasons to return immediately to the ER.  Patient expresses understanding and  agrees with plan. I strongly encouraged patient to follow up with [facility] gastroenterology for this pain as he has seen in the past for colonoscopy.  BP 120/86  Pulse 78  Temp(Src) 98.1 F (36.7 C) (Oral)  Resp 16  SpO2 98%   Signed,  Ladona Mow, PA-C 12:33 AM   This patient seen and discussed with Dr. Jerelyn Scott, MD      Monte Fantasia, PA-C 06/22/14 0033  Monte Fantasia, PA-C 06/22/14 [phone]

## 2014-06-21 NOTE — Discharge Instructions (Signed)
Followup with primary care physician and gastroenterologist. Return to the ER if he develops any nausea, vomiting, worsening abdominal pain, fever, diarrhea, chest pain, shortness of breath, trouble urinating.  Abdominal Pain Many things can cause abdominal pain. Usually, abdominal pain is not caused by a disease and will improve without treatment. It can often be observed and treated at home. Your health care provider will do a physical exam and possibly order blood tests and X-rays to help determine the seriousness of your pain. However, in many cases, more time must pass before a clear cause of the pain can be found. Before that point, your health care provider may not know if you need more testing or further treatment. HOME CARE INSTRUCTIONS  Monitor your abdominal pain for any changes. The following actions may help to alleviate any discomfort you are experiencing:  Only take over-the-counter or prescription medicines as directed by your health care provider.  Do not take laxatives unless directed to do so by your health care provider.  Try a clear liquid diet (broth, tea, or water) as directed by your health care provider. Slowly move to a bland diet as tolerated. SEEK MEDICAL CARE IF:  You have unexplained abdominal pain.  You have abdominal pain associated with nausea or diarrhea.  You have pain when you urinate or have a bowel movement.  You experience abdominal pain that wakes you in the night.  You have abdominal pain that is worsened or improved by eating food.  You have abdominal pain that is worsened with eating fatty foods.  You have a fever. SEEK IMMEDIATE MEDICAL CARE IF:   Your pain does not go away within 2 hours.  You keep throwing up (vomiting).  Your pain is felt only in portions of the abdomen, such as the right side or the left lower portion of the abdomen.  You pass bloody or black tarry stools. MAKE SURE YOU:  Understand these instructions.   Will  watch your condition.   Will get help right away if you are not doing well or get worse.  Document Released: 06/24/2005 Document Revised: 09/19/2013 Document Reviewed: 05/24/2013 [facility] Patient Information 2015 [place], [place]. This information is not intended to replace advice given to you by your health care provider. Make sure you discuss any questions you have with your health care provider.

## 2014-06-21 NOTE — ED Notes (Signed)
Pt c/o right sided abd pain and rectal pain x 1 month; pt sts seen for same without diagnosis

## 2014-06-21 NOTE — ED Notes (Signed)
Pt drank CT contrast approximately 15 minutes ago, per pt.; CT called

## 2014-06-22 NOTE — ED Provider Notes (Signed)
Medical screening examination/treatment/procedure(s) were conducted as a shared visit with non-physician practitioner(s) and myself.  I personally evaluated the patient during the encounter.   EKG Interpretation None     Pt seen and examined, having right sided abdominal pain.  Mild tenderness on exam, no gaurding or rebound tenderness.  Abdominal CT scan negative for acute findings.    Ethelda Chick, MD 06/22/14 0040

## 2014-11-01 ENCOUNTER — Other Ambulatory Visit: Payer: Self-pay | Admitting: Urology

## 2014-11-06 ENCOUNTER — Other Ambulatory Visit: Payer: Self-pay | Admitting: Urology

## 2014-11-12 ENCOUNTER — Other Ambulatory Visit: Payer: Self-pay | Admitting: Urology

## 2014-11-15 ENCOUNTER — Encounter (HOSPITAL_BASED_OUTPATIENT_CLINIC_OR_DEPARTMENT_OTHER): Payer: Self-pay | Admitting: *Deleted

## 2014-11-15 NOTE — Progress Notes (Signed)
NPO AFTER MN INCLUDING NO DIP TOBACCO, PT VERBALIZED UNDERSTANDING.  ARRIVE AT 0845. NEEDS ISTAT AND EKG. WILL TAKE AM MEDS WITH EXCEPTION NO LISINOPRIL/ HCTZ DOS W/ SIPS OF WATER.

## 2014-11-15 NOTE — Progress Notes (Signed)
   11/15/14 1541  OBSTRUCTIVE SLEEP APNEA  Have you ever been diagnosed with sleep apnea through a sleep study? No  Do you snore loudly (loud enough to be heard through closed doors)?  0  Do you often feel tired, fatigued, or sleepy during the daytime? 0  Has anyone observed you stop breathing during your sleep? 0  Do you have, or are you being treated for high blood pressure? 1  BMI more than 35 kg/m2? 0  Age over 69 years old? 1  Neck circumference greater than 40 cm/16 inches? 1  Gender: 1  Obstructive Sleep Apnea Score 4  Score 4 or greater  Results sent to PCP

## 2014-11-19 NOTE — H&P (Signed)
History of Present Illness                        F/u - PCP Dr. Little       1-BPH - Aug 2012 - cysto Aug 2012 showed trilobar hypertrophy and a median lobe.   -Dec 2015 - normal DRE    2 - Nephrolithiasis - on surveillance, pt noted he has known about the kidney stone for "15 years" and did not want them treated.   -Aug 2012 - CT Abd/Pelvis Aug 8,2012 at [facility] report: 22 mm right renal pelvic stone with minimal hydronephrosis. A LLP 17 mm stone, no hydronephrosis.There were no ureteral stones. Small bilateral inguinal hernias containing fat were noted. No pelvic stones, masses or LAD. The prostate was enlarged and bladder mildly thickened.   -Sep 2015 - CT a/p - 2.4 cm right renal pelvic stone and a 13 mm left lower pole   -Dec 2015 ruq pain, KUB - a large right renal pelvic stone (29 mm) and a left lower pole stone. Possibly slightly bigger.     3-pelvic pain - Jul 2012 - for 5 months patient has had bilateral testicle and inguinal pain. This concerns him most. Tamsulosin has made no difference. He also has frequent urination and nocturia x 3. He was started on Elavil and finasteride in 2012.   -Dec 2015 penile pain, stopped Elavil.     4- MH - Aug 2012 - cysto and CT benign (bph, stones)      Jan 2016 interval hx  Patient returns and continued evaluation of some abdominal pain, penile pain as well as nephrolithiasis. He restarted amitriptyline.    I had ordered a PSA but he didn't get this drawn. His DRE was normal last visit. He brought a nice note today which describes his pelvic pain which is significantly improved now that he is back on his amitriptyline.     Patient has no dysuria or gross hematuria today.    He continues to have some right upper quadrant and right flank pain.           Past Medical History Problems  1. History of Arthritis 2. History of Gout (M10.9) 3. History of depression (Z86.59) 4. History of hypertension  (Z86.79) 5. History of kidney stones (Z87.442) 6. History of Murmur (R01.1)  Surgical History Problems  1. History of Lung Surgery  Current Meds 1. Allopurinol 300 MG Oral Tablet;  Therapy: (Recorded:08Oct2009) to Recorded 2. Amitriptyline HCl - 10 MG Oral Tablet; TAKE 1 TABLET AT BEDTIME;  Therapy: 23Dec2015 to (Evaluate:17Nov2016)  Requested for: 23Dec2015; Last  Rx:23Dec2015 Ordered 3. AmLODIPine Besylate 5 MG Oral Tablet;  Therapy: (Recorded:16Jul2012) to Recorded 4. Azithromycin 250 MG Oral Tablet;  Therapy: (Recorded:18Dec2015) to Recorded 5. Carvedilol 12.5 MG Oral Tablet;  Therapy: (Recorded:16Jul2012) to Recorded 6. Finasteride 5 MG Oral Tablet; Take 1 tablet daily;  Therapy: 21Aug2012 to (Evaluate:15Dec2014)  Requested for: 15Jan2015; Last  Rx:16Oct2014 Ordered 7. Hydrochlorothiazide 25 MG Oral Tablet;  Therapy: (Recorded:18Dec2015) to Recorded 8. Hydrocodone-Acetaminophen 7.5-325 MG Oral Tablet;  Therapy: (Recorded:18Dec2015) to Recorded 9. Lisinopril 10 MG Oral Tablet;  Therapy: (Recorded:16Jul2012) to Recorded 10. Nitrostat 0.4 MG Sublingual Tablet Sublingual;   Therapy: 10Apr2013 to Recorded 11. Phenazopyridine HCl - 200 MG Oral Tablet; TAKE 1 TABLET 3 TIMES DAILY AS NEEDED   FOR PAIN;   Therapy: 18Dec2015 to (Evaluate:07Jan2016)  Requested for: 18Dec2015; Last   Rx:18Dec2015 Ordered 12. Polyethylene Glycol 3350 Oral Powder;     Therapy: (Recorded:18Dec2015) to Recorded 13. Pravastatin Sodium 40 MG Oral Tablet;   Therapy: 22May2013 to Recorded 14. Propylthiouracil 50 MG Oral Tablet;   Therapy: (Recorded:08Oct2009) to Recorded 15. Temazepam 30 MG Oral Capsule;   Therapy: (Recorded:18Dec2015) to Recorded  Allergies Medication  1. No Known Drug Allergies  Family History Problems  1. Family history of Death In The Family Father 2. Family history of Death In The Family Mother 3. Family history of Family Health Status Number Of Children   1 son  (32) & 1  daughter (33) 4. No pertinent family history : Mother  Social History Problems  1. Alcohol Use   5 per day 2. Caffeine Use   1 per day 3. Former smoker (Z87.891) 4. Marital History - Currently Married 5. Occupation:   yardwork 6. Tobacco Use   smoked 2 ppd for 30 yrs & quit 10 yrs ago  Vitals Vital Signs [Data Includes: Last 1 Day]  Recorded: 27Jan2016 01:58PM  Blood Pressure: 130 / 79 Temperature: 97.7 F Heart Rate: 67  Results/Data Urine [Data Includes: Last 1 Day]   27Jan2016  COLOR YELLOW   APPEARANCE CLEAR   SPECIFIC GRAVITY 1.025   pH 5.0   GLUCOSE NEG mg/dL  BILIRUBIN NEG   KETONE NEG mg/dL  BLOOD LARGE   PROTEIN NEG mg/dL  UROBILINOGEN 0.2 mg/dL  NITRITE NEG   LEUKOCYTE ESTERASE TRACE   SQUAMOUS EPITHELIAL/HPF RARE   WBC 3-6 WBC/hpf  RBC 21-50 RBC/hpf  BACTERIA RARE   CRYSTALS NONE SEEN   CASTS NONE SEEN    Procedure KUB today comparison to CT 2015, CT 2012 in 2009-reviewed all these images, findings: The bones appeared normal. The bowel gas pattern appear normal. Large right renal pelvic stone. Left lower pole stone. No obvious stones over the course of the ureters or bladder.     Assessment Assessed  1. Nephrolithiasis (N20.0) 2. Bacteriuria, asymptomatic (N39.0)  He had a normal exam today. The penis was nontender on exam. It appeared normal. Prostate was normal. Abdomen was soft and nontender.   Plan Bacteriuria, asymptomatic  1. URINE CULTURE; Status:Hold For - Specimen/Data Collection,Appointment; Requested  for:27Jan2016;  Health Maintenance  2. UA With REFLEX; [Do Not Release]; Status:Resulted - Requires Verification;   Done:  27Jan2016 01:28PM Nephrolithiasis  3. Follow-up Schedule Surgery Office  Follow-up  Status: Hold For - Appointment   Requested for: 27Jan2016  Discussion/Summary       Pelvic pain-much improved now that he is back on Elavil.    Nephrolithiasis-we discussed again the nature risks benefits and  alternatives to right PCNL. Discussed risk of bleeding infection colon injury, need for staged procedure among others. Discussed stent and f/u. We'll get IR to place nephroureteral catheter hopefully in the middle or upper pole access.    BPH-patient did want to check his PSA and I added to his preop labs.     Signatures Electronically signed by : Dalia Jollie, M.D.; Oct 24 2014  2:39PM EST   Addendum: urine Cx negative  

## 2014-11-20 ENCOUNTER — Encounter (HOSPITAL_BASED_OUTPATIENT_CLINIC_OR_DEPARTMENT_OTHER): Payer: Self-pay | Admitting: *Deleted

## 2014-11-20 ENCOUNTER — Ambulatory Visit (HOSPITAL_BASED_OUTPATIENT_CLINIC_OR_DEPARTMENT_OTHER)
Admission: RE | Admit: 2014-11-20 | Discharge: 2014-11-20 | Disposition: A | Discharge status: Home / Self Care | Payer: Medicare Other | Source: Ambulatory Visit | Attending: Urology | Admitting: Urology

## 2014-11-20 ENCOUNTER — Encounter (HOSPITAL_BASED_OUTPATIENT_CLINIC_OR_DEPARTMENT_OTHER)
Admission: RE | Disposition: A | Discharge status: Home / Self Care | Payer: Self-pay | Source: Ambulatory Visit | Attending: Urology

## 2014-11-20 ENCOUNTER — Encounter (HOSPITAL_COMMUNITY): Payer: Self-pay | Admitting: *Deleted

## 2014-11-20 ENCOUNTER — Ambulatory Visit (HOSPITAL_BASED_OUTPATIENT_CLINIC_OR_DEPARTMENT_OTHER): Payer: Medicare Other | Admitting: Certified Registered"

## 2014-11-20 DIAGNOSIS — I10 Essential (primary) hypertension: Secondary | ICD-10-CM | POA: Insufficient documentation

## 2014-11-20 DIAGNOSIS — M1990 Unspecified osteoarthritis, unspecified site: Secondary | ICD-10-CM | POA: Diagnosis not present

## 2014-11-20 DIAGNOSIS — M109 Gout, unspecified: Secondary | ICD-10-CM | POA: Insufficient documentation

## 2014-11-20 DIAGNOSIS — R102 Pelvic and perineal pain: Secondary | ICD-10-CM | POA: Diagnosis present

## 2014-11-20 DIAGNOSIS — F329 Major depressive disorder, single episode, unspecified: Secondary | ICD-10-CM | POA: Diagnosis not present

## 2014-11-20 DIAGNOSIS — Z87891 Personal history of nicotine dependence: Secondary | ICD-10-CM | POA: Insufficient documentation

## 2014-11-20 DIAGNOSIS — N359 Urethral stricture, unspecified: Secondary | ICD-10-CM | POA: Diagnosis not present

## 2014-11-20 DIAGNOSIS — N400 Enlarged prostate without lower urinary tract symptoms: Secondary | ICD-10-CM | POA: Insufficient documentation

## 2014-11-20 DIAGNOSIS — N200 Calculus of kidney: Secondary | ICD-10-CM | POA: Diagnosis not present

## 2014-11-20 DIAGNOSIS — Z87442 Personal history of urinary calculi: Secondary | ICD-10-CM | POA: Insufficient documentation

## 2014-11-20 DIAGNOSIS — Z79899 Other long term (current) drug therapy: Secondary | ICD-10-CM | POA: Diagnosis not present

## 2014-11-20 DIAGNOSIS — N390 Urinary tract infection, site not specified: Secondary | ICD-10-CM | POA: Insufficient documentation

## 2014-11-20 DIAGNOSIS — E0590 Thyrotoxicosis, unspecified without thyrotoxic crisis or storm: Secondary | ICD-10-CM | POA: Insufficient documentation

## 2014-11-20 DIAGNOSIS — Z79891 Long term (current) use of opiate analgesic: Secondary | ICD-10-CM | POA: Diagnosis not present

## 2014-11-20 HISTORY — DX: Unspecified osteoarthritis, unspecified site: M19.90

## 2014-11-20 HISTORY — DX: Other constipation: K59.09

## 2014-11-20 HISTORY — DX: Other specified personal risk factors, not elsewhere classified: Z91.89

## 2014-11-20 HISTORY — DX: Cyst of kidney, acquired: N28.1

## 2014-11-20 HISTORY — DX: Other chronic pain: G89.29

## 2014-11-20 HISTORY — PX: CYSTOSCOPY WITH STENT PLACEMENT: SHX5790

## 2014-11-20 HISTORY — DX: Low back pain, unspecified: M54.50

## 2014-11-20 HISTORY — DX: Calculus of kidney: N20.0

## 2014-11-20 HISTORY — DX: Thyrotoxicosis, unspecified without thyrotoxic crisis or storm: E05.90

## 2014-11-20 HISTORY — DX: Low back pain: M54.5

## 2014-11-20 HISTORY — DX: Rosacea, unspecified: L71.9

## 2014-11-20 HISTORY — DX: Atrioventricular block, first degree: I44.0

## 2014-11-20 LAB — POCT I-STAT 4, (NA,K, GLUC, HGB,HCT)
Glucose, Bld: 142 mg/dL — ABNORMAL HIGH (ref 70–99)
HCT: 49 % (ref 39.0–52.0)
Hemoglobin: 16.7 g/dL (ref 13.0–17.0)
Potassium: 3.8 mmol/L (ref 3.5–5.1)
Sodium: 137 mmol/L (ref 135–145)

## 2014-11-20 SURGERY — CYSTOSCOPY, WITH STENT INSERTION
Anesthesia: General | Site: Ureter | Laterality: Right

## 2014-11-20 MED ORDER — CEFAZOLIN SODIUM 1-5 GM-% IV SOLN
1.0000 g | INTRAVENOUS | Status: AC
  Administered 2014-11-20: 2 g via INTRAVENOUS
  Filled 2014-11-20: qty 50

## 2014-11-20 MED ORDER — LACTATED RINGERS IV SOLN
INTRAVENOUS | Status: DC
  Administered 2014-11-20: 10:00:00 via INTRAVENOUS
  Filled 2014-11-20: qty 1000

## 2014-11-20 MED ORDER — CEFAZOLIN SODIUM-DEXTROSE 2-3 GM-% IV SOLR
INTRAVENOUS | Status: AC
  Filled 2014-11-20: qty 50

## 2014-11-20 MED ORDER — HYDROCODONE-ACETAMINOPHEN 7.5-325 MG PO TABS
1.0000 | ORAL_TABLET | Freq: Four times a day (QID) | ORAL | Status: DC | PRN
  Administered 2014-11-20: 1 via ORAL
  Filled 2014-11-20: qty 1

## 2014-11-20 MED ORDER — ONDANSETRON HCL 4 MG/2ML IJ SOLN
INTRAMUSCULAR | Status: DC | PRN
  Administered 2014-11-20: 4 mg via INTRAVENOUS

## 2014-11-20 MED ORDER — ONDANSETRON HCL 4 MG/2ML IJ SOLN
4.0000 mg | Freq: Once | INTRAMUSCULAR | Status: DC | PRN
  Filled 2014-11-20: qty 2

## 2014-11-20 MED ORDER — MIDAZOLAM HCL 5 MG/5ML IJ SOLN
INTRAMUSCULAR | Status: DC | PRN
  Administered 2014-11-20: 1 mg via INTRAVENOUS

## 2014-11-20 MED ORDER — DEXAMETHASONE SODIUM PHOSPHATE 4 MG/ML IJ SOLN
INTRAMUSCULAR | Status: DC | PRN
  Administered 2014-11-20: 8 mg via INTRAVENOUS

## 2014-11-20 MED ORDER — SODIUM CHLORIDE 0.9 % IR SOLN
Status: DC | PRN
Start: 2014-11-20 — End: 2014-11-20
  Administered 2014-11-20: 3000 mL via INTRAVESICAL

## 2014-11-20 MED ORDER — MIDAZOLAM HCL 2 MG/2ML IJ SOLN
INTRAMUSCULAR | Status: AC
  Filled 2014-11-20: qty 2

## 2014-11-20 MED ORDER — PHENAZOPYRIDINE HCL 100 MG PO TABS
ORAL_TABLET | ORAL | Status: AC
  Filled 2014-11-20: qty 2

## 2014-11-20 MED ORDER — HYDROMORPHONE HCL 1 MG/ML IJ SOLN
0.2500 mg | INTRAMUSCULAR | Status: DC | PRN
  Filled 2014-11-20: qty 1

## 2014-11-20 MED ORDER — IOHEXOL 350 MG/ML SOLN
INTRAVENOUS | Status: DC | PRN
  Administered 2014-11-20: 8 mL via URETHRAL

## 2014-11-20 MED ORDER — FENTANYL CITRATE 0.05 MG/ML IJ SOLN
INTRAMUSCULAR | Status: DC | PRN
  Administered 2014-11-20 (×2): 50 ug via INTRAVENOUS

## 2014-11-20 MED ORDER — LIDOCAINE HCL (CARDIAC) 20 MG/ML IV SOLN
INTRAVENOUS | Status: DC | PRN
  Administered 2014-11-20: 60 mg via INTRAVENOUS

## 2014-11-20 MED ORDER — HYDROCODONE-ACETAMINOPHEN 7.5-325 MG PO TABS
ORAL_TABLET | ORAL | Status: AC
  Filled 2014-11-20: qty 1

## 2014-11-20 MED ORDER — PHENAZOPYRIDINE HCL 200 MG PO TABS
200.0000 mg | ORAL_TABLET | Freq: Three times a day (TID) | ORAL | Status: DC
  Administered 2014-11-20: 200 mg via ORAL
  Filled 2014-11-20: qty 1

## 2014-11-20 MED ORDER — PROPOFOL 10 MG/ML IV BOLUS
INTRAVENOUS | Status: DC | PRN
  Administered 2014-11-20: 70 mg via INTRAVENOUS

## 2014-11-20 MED ORDER — FENTANYL CITRATE 0.05 MG/ML IJ SOLN
INTRAMUSCULAR | Status: AC
Start: 2014-11-20 — End: 2014-11-20
  Filled 2014-11-20: qty 6

## 2014-11-20 SURGICAL SUPPLY — 17 items
BAG DRAIN URO-CYSTO SKYTR STRL (DRAIN) ×3 IMPLANT
CANISTER SUCT LVC 12 LTR MEDI- (MISCELLANEOUS) ×3 IMPLANT
CATH INTERMIT  6FR 70CM (CATHETERS) ×3 IMPLANT
CLOTH BEACON ORANGE TIMEOUT ST (SAFETY) ×3 IMPLANT
GLOVE BIO SURGEON STRL SZ 6.5 (GLOVE) ×2 IMPLANT
GLOVE BIO SURGEON STRL SZ7.5 (GLOVE) ×6 IMPLANT
GLOVE BIO SURGEONS STRL SZ 6.5 (GLOVE) ×1
GLOVE BIOGEL PI IND STRL 6.5 (GLOVE) ×2 IMPLANT
GLOVE BIOGEL PI INDICATOR 6.5 (GLOVE) ×4
GOWN STRL REUS W/ TWL LRG LVL3 (GOWN DISPOSABLE) ×1 IMPLANT
GOWN STRL REUS W/ TWL XL LVL3 (GOWN DISPOSABLE) ×1 IMPLANT
GOWN STRL REUS W/TWL LRG LVL3 (GOWN DISPOSABLE) ×2
GOWN STRL REUS W/TWL XL LVL3 (GOWN DISPOSABLE) ×2
GUIDEWIRE ANG ZIPWIRE 038X150 (WIRE) IMPLANT
GUIDEWIRE STR DUAL SENSOR (WIRE) ×3 IMPLANT
PACK CYSTO (CUSTOM PROCEDURE TRAY) ×3 IMPLANT
STENT URET 6FRX26 CONTOUR (STENTS) ×3 IMPLANT

## 2014-11-20 NOTE — Anesthesia Postprocedure Evaluation (Signed)
  Anesthesia Post-op Note  Patient: Phillip Wong  Procedure(s) Performed: Procedure(s): CYSTOSCOPY WITH RIGHT RETROGRADE PYLEGRAM AND RIGHT URETERAL STENT PLACEMENT (Right)  Patient Location: PACU  Anesthesia Type:General  Level of Consciousness: awake, alert , oriented and patient cooperative  Airway and Oxygen Therapy: Patient Spontanous Breathing  Post-op Pain: mild  Post-op Assessment: Post-op Vital signs reviewed, Patient's Cardiovascular Status Stable, Respiratory Function Stable, Patent Airway, No signs of Nausea or vomiting and Pain level controlled  Post-op Vital Signs: stable  Last Vitals:  Filed Vitals:   11/20/14 1115  BP: 145/74  Pulse: 72  Temp:   Resp: 12    Complications: No apparent anesthesia complications

## 2014-11-20 NOTE — Transfer of Care (Signed)
Immediate Anesthesia Transfer of Care Note  Patient: Phillip Wong  Procedure(s) Performed: Procedure(s) (LRB): CYSTOSCOPY WITH RIGHT RETROGRADE PYLEGRAM AND RIGHT URETERAL STENT PLACEMENT (Right)  Patient Location: PACU  Anesthesia Type: General  Level of Consciousness: awake, oriented, sedated and patient cooperative  Airway & Oxygen Therapy: Patient Spontanous Breathing and Patient connected to face mask oxygen  Post-op Assessment: Report given to PACU RN and Post -op Vital signs reviewed and stable  Post vital signs: Reviewed and stable  Complications: No apparent anesthesia complications

## 2014-11-20 NOTE — Anesthesia Preprocedure Evaluation (Addendum)
Anesthesia Evaluation  Patient identified by MRN, date of birth, ID band Patient awake    Reviewed: Allergy & Precautions, NPO status , Patient's Chart, lab work & pertinent test results  Airway        Dental   Pulmonary former smoker,          Cardiovascular hypertension,     Neuro/Psych    GI/Hepatic   Endo/Other  Hyperthyroidism   Renal/GU Renal disease     Musculoskeletal  (+) Arthritis -,   Abdominal   Peds  Hematology   Anesthesia Other Findings Kidney stone  Reproductive/Obstetrics                            Anesthesia Physical Anesthesia Plan  ASA: II  Anesthesia Plan: General   Post-op Pain Management:    Induction:   Airway Management Planned:   Additional Equipment:   Intra-op Plan:   Post-operative Plan:   Informed Consent:   Plan Discussed with:   Anesthesia Plan Comments:         Anesthesia Quick Evaluation

## 2014-11-20 NOTE — Interval H&P Note (Signed)
History and Physical Interval Note:  11/20/2014 10:22 AM  Phillip Wong  has presented today for surgery, with the diagnosis of RIGHT RENAL STONE  The various methods of treatment have been discussed with the patient and family. After consideration of risks, benefits and other options for treatment, the patient has consented to  Procedure(s): CYSTOSCOPY WITH STENT PLACEMENT (Right) as a surgical intervention .  The patient's history has been reviewed, patient examined, no change in status, stable for surgery.  I have reviewed the patient's chart and labs.  Questions were answered to the patient's satisfaction.  Discussed again with patient and family large stone burden and nature, risks, benefits of PCNL vs. Multiple staged procedures with likely ESWL and URS combination. All questions answered. He is set on staged procedure and wants to avoid any "cut through back into kidney with risks of bleeding". Otherwise he has been well. No complaints today apart from typical leg pain.    Susannah Carbin

## 2014-11-20 NOTE — Op Note (Signed)
Preoperative diagnosis: Right renal stone, pelvic pain, left renal stone Postoperative diagnosis: Same  Procedure:  Exam under anesthesia, Cystoscopy with right retrograde pyelogram, Right ureteral stent placement in a staged procedure to treat right renal stone  Surgeon: Mena GoesEskridge  Anesthesia: Smith  Type of anesthesia: Gen.  Indication for procedure: Pt is a 69 year old male has a lot of issues with pain was seen to been having more right flank pain recently. Also had some pelvic pain. He restarted amitriptyline and seemed to improve from the pelvic pain perspective but continued to complain of some right abdominal right flank pain. He does have a large stone in the right UPJ which may have gone slightly bigger over the past few years. Discussed with the patient the nature risks benefits of a PCNL on the right, staged procedure with endoscopy and extracorporeal shockwave lithotripsy. All questions answered and after multiple discussions he settled on a staged procedure with endoscopic and extracorporeal procedures. He is brought today for stent placement to prevent obstruction from stone fragments as well as to passively dilate the right ureter.  Findings: On exam under anesthesia the penis was normal without mass or lesion. The testicles were descended bilaterally without mass. There were palpably normal. On digital rectal exam prostate was small, smooth and without hard area or nodules.  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 Prostatic urethra was mildly elongated and partially obstructed. The bladder mucosa appeared normal. There were no stones or foreign bodies in the bladder. The bladder contains some trabeculation. The mucosa appeared normal.  Right retrograde pyelogram - outlined a a single ureter single collecting system unit. There is a filling defect in the renal pelvis consistent with  the kidney stone. There was some mild dilation of the infundibula proximal to the stone. On scout imaging the stone was present in the right renal pelvis. Otherwise retrograde unremarkable without other stricture or filling defect.  Description of procedure: After consent was obtained patient brought to the operating room. After adequate anesthesia he is placed on lithotomy position. He was prepped and draped in the usual sterile fashion. A timeout was performed to confirm the patient and procedure. I did an exam under anesthesia. The cystoscope was passed per urethra and the bladder examined. The right ureteral orifice was cannulated with a 6 [place] open-ended catheter and retrograde injection of contrast was performed. The wire was then advanced and cold in the upper pole collecting system. Over the wire 6 x 26 and ureter stent was advanced. As the wire was removed the patient voided rather forcefully and the trigone and bladder collapsed around the scope and bladder neck. This ended up pushing the stent into far. I used graspers to grasp the tip of the stent protruding out from the right ureteral orifice and pulled the stent down into position with a good coil in the bladder and a good coil reconstituting in the right renal pelvis. The bladder was drained and the scope removed. The patient was awakened taken to recovery room in stable condition.  Complications: None  Blood loss: Minimal  Drains: 6 x 26 cm right ureteral stent  Specimens: None  Disposition: Patient stable to PACU

## 2014-11-20 NOTE — Discharge Instructions (Signed)
CYSTOSCOPY HOME CARE INSTRUCTIONS  Activity: Rest for the remainder of the day.  Do not drive or operate equipment today.  You may resume normal activities in one to two days as instructed by your physician.   Meals: Drink plenty of liquids and eat light foods such as gelatin or soup this evening.  You may return to a normal meal plan tomorrow.  Return to Work: You may return to work in one to two days or as instructed by your physician.  Special Instructions / Symptoms: Call your physician if any of these symptoms occur:   -persistent or heavy bleeding  -bleeding which continues after first few urination  -large blood clots that are difficult to pass  -urine stream diminishes or stops completely  -fever equal to or higher than 101 degrees Farenheit.  -cloudy urine with a strong, foul odor  -severe pain  You may feel some burning pain when you urinate.  This should disappear with time.  Applying moist heat to the lower abdomen or a hot tub bath may help relieve the pain.  Ureteral Stent Implantation, Care After Refer to this sheet in the next few weeks. These instructions provide you with information on caring for yourself after your procedure. Your health care provider may also give you more specific instructions. Your treatment has been planned according to current medical practices, but problems sometimes occur. Call your health care provider if you have any problems or questions after your procedure. WHAT TO EXPECT AFTER THE PROCEDURE You should be back to normal activity within 48 hours after the procedure. Nausea and vomiting may occur and are commonly the result of anesthesia. It is common to experience sharp pain in the back or lower abdomen and penis with voiding. This is caused by movement of the ends of the stent with the act of urinating.It usually goes away within minutes after you have stopped urinating. HOME CARE INSTRUCTIONS Make sure to drink plenty of fluids. You may  have small amounts of bleeding, causing your urine to be red. This is normal. Certain movements may trigger pain or a feeling that you need to urinate. You may be given medicines to prevent infection or bladder spasms. Be sure to take all medicines as directed. Only take over-the-counter or prescription medicines for pain, discomfort, or fever as directed by your health care provider. Do not take aspirin, as this can make bleeding worse. Your stent will be left in until the blockage is resolved. This may take 2 weeks or longer, depending on the reason for stent implantation. You may have an X-ray exam to make sure your ureter is open and that the stent has not moved out of position (migrated). The stent can be removed by your health care provider in the office. Medicines may be given for comfort while the stent is being removed. Be sure to keep all follow-up appointments so your health care provider can check that you are healing properly. SEEK MEDICAL CARE IF:  You experience increasing pain.  Your pain medicine is not working. SEEK IMMEDIATE MEDICAL CARE IF:  Your urine is dark red or has blood clots.  You are leaking urine (incontinent).  You have a fever, chills, feeling sick to your stomach (nausea), or vomiting.  Your pain is not relieved by pain medicine.  The end of the stent comes out of the urethra.  You are unable to urinate. Document Released: 05/17/2013 Document Revised: 09/19/2013 Document Reviewed: 05/17/2013 [facility] Patient Information 2015 [place], [place]. This information  is not intended to replace advice given to you by your health care provider. Make sure you discuss any questions you have with your health care provider.   Post Anesthesia Home Care Instructions  Activity: Get plenty of rest for the remainder of the day. A responsible adult should stay with you for 24 hours following the procedure.  For the next 24 hours, DO NOT: -Drive a car -Social workerperate  machinery -Drink alcoholic beverages -Take any medication unless instructed by your physician -Make any legal decisions or sign important papers.  Meals: Start with liquid foods such as gelatin or soup. Progress to regular foods as tolerated. Avoid greasy, spicy, heavy foods. If nausea and/or vomiting occur, drink only clear liquids until the nausea and/or vomiting subsides. Call your physician if vomiting continues.  Special Instructions/Symptoms: Your throat may feel dry or sore from the anesthesia or the breathing tube placed in your throat during surgery. If this causes discomfort, gargle with warm salt water. The discomfort should disappear within 24 hours.

## 2014-11-20 NOTE — Anesthesia Procedure Notes (Signed)
Procedure Name: LMA Insertion Date/Time: 11/20/2014 10:29 AM Performed by: Renella CunasHAZEL, Renisha Cockrum D Pre-anesthesia Checklist: Patient identified, Emergency Drugs available, Suction available and Patient being monitored Patient Re-evaluated:Patient Re-evaluated prior to inductionOxygen Delivery Method: Circle System Utilized Preoxygenation: Pre-oxygenation with 100% oxygen Intubation Type: IV induction Ventilation: Mask ventilation without difficulty LMA: LMA inserted LMA Size: 5.0 Number of attempts: 1 Airway Equipment and Method: Bite block Placement Confirmation: positive ETCO2 Tube secured with: Tape Dental Injury: Teeth and Oropharynx as per pre-operative assessment

## 2014-11-21 ENCOUNTER — Encounter (HOSPITAL_BASED_OUTPATIENT_CLINIC_OR_DEPARTMENT_OTHER): Payer: Self-pay | Admitting: Urology

## 2014-11-22 ENCOUNTER — Ambulatory Visit (HOSPITAL_COMMUNITY)
Admission: RE | Admit: 2014-11-22 | Discharge: 2014-11-22 | Disposition: A | Discharge status: Home / Self Care | Payer: Medicare Other | Source: Ambulatory Visit | Attending: Urology | Admitting: Urology

## 2014-11-22 ENCOUNTER — Encounter (HOSPITAL_COMMUNITY): Payer: Self-pay | Admitting: *Deleted

## 2014-11-22 ENCOUNTER — Ambulatory Visit (HOSPITAL_COMMUNITY): Payer: Medicare Other

## 2014-11-22 ENCOUNTER — Encounter (HOSPITAL_COMMUNITY)
Admission: RE | Disposition: A | Discharge status: Home / Self Care | Payer: Self-pay | Source: Ambulatory Visit | Attending: Urology

## 2014-11-22 DIAGNOSIS — N202 Calculus of kidney with calculus of ureter: Secondary | ICD-10-CM | POA: Diagnosis present

## 2014-11-22 DIAGNOSIS — N401 Enlarged prostate with lower urinary tract symptoms: Secondary | ICD-10-CM | POA: Diagnosis not present

## 2014-11-22 DIAGNOSIS — R350 Frequency of micturition: Secondary | ICD-10-CM | POA: Diagnosis not present

## 2014-11-22 DIAGNOSIS — R351 Nocturia: Secondary | ICD-10-CM | POA: Diagnosis not present

## 2014-11-22 DIAGNOSIS — N200 Calculus of kidney: Secondary | ICD-10-CM

## 2014-11-22 SURGERY — LITHOTRIPSY, ESWL
Anesthesia: LOCAL | Laterality: Right

## 2014-11-22 MED ORDER — TAMSULOSIN HCL 0.4 MG PO CAPS: 0.4000 mg | ORAL_CAPSULE | Freq: Every day | ORAL | Status: DC

## 2014-11-22 MED ORDER — CIPROFLOXACIN HCL 500 MG PO TABS
500.0000 mg | ORAL_TABLET | ORAL | Status: AC
  Administered 2014-11-22: 500 mg via ORAL
  Filled 2014-11-22: qty 1

## 2014-11-22 MED ORDER — DIAZEPAM 5 MG PO TABS
10.0000 mg | ORAL_TABLET | ORAL | Status: AC
  Administered 2014-11-22: 10 mg via ORAL
  Filled 2014-11-22: qty 2

## 2014-11-22 MED ORDER — SODIUM CHLORIDE 0.9 % IV SOLN
INTRAVENOUS | Status: DC
  Administered 2014-11-22: 07:00:00 via INTRAVENOUS

## 2014-11-22 MED ORDER — DIPHENHYDRAMINE HCL 25 MG PO CAPS
25.0000 mg | ORAL_CAPSULE | ORAL | Status: AC
  Administered 2014-11-22: 25 mg via ORAL
  Filled 2014-11-22: qty 1

## 2014-11-22 NOTE — Discharge Instructions (Signed)

## 2014-11-22 NOTE — H&P (View-Only) (Signed)
History of Present Illness                        F/u - PCP Dr. Clarene Duke       1-BPH - Aug 2012 - cysto Aug 2012 showed trilobar hypertrophy and a median lobe.   -Dec 2015 - normal DRE    2 - Nephrolithiasis - on surveillance, pt noted he has known about the kidney stone for "15 years" and did not want them treated.   -Aug 2012 - CT Abd/Pelvis Aug 8,2012 at [facility] report: 22 mm right renal pelvic stone with minimal hydronephrosis. A LLP 17 mm stone, no hydronephrosis.There were no ureteral stones. Small bilateral inguinal hernias containing fat were noted. No pelvic stones, masses or LAD. The prostate was enlarged and bladder mildly thickened.   -Sep 2015 - CT a/p - 2.4 cm right renal pelvic stone and a 13 mm left lower pole   -Dec 2015 ruq pain, KUB - a large right renal pelvic stone (29 mm) and a left lower pole stone. Possibly slightly bigger.     3-pelvic pain - Jul 2012 - for 5 months patient has had bilateral testicle and inguinal pain. This concerns him most. Tamsulosin has made no difference. He also has frequent urination and nocturia x 3. He was started on Elavil and finasteride in 2012.   -Dec 2015 penile pain, stopped Elavil.     4- MH - Aug 2012 - cysto and CT benign (bph, stones)      Jan 2016 interval hx  Patient returns and continued evaluation of some abdominal pain, penile pain as well as nephrolithiasis. He restarted amitriptyline.    I had ordered a PSA but he didn't get this drawn. His DRE was normal last visit. He brought a nice note today which describes his pelvic pain which is significantly improved now that he is back on his amitriptyline.     Patient has no dysuria or gross hematuria today.    He continues to have some right upper quadrant and right flank pain.           Past Medical History Problems  1. History of Arthritis 2. History of Gout (M10.9) 3. History of depression (Z86.59) 4. History of hypertension  (Z86.79) 5. History of kidney stones (Z87.442) 6. History of Murmur (R01.1)  Surgical History Problems  1. History of Lung Surgery  Current Meds 1. Allopurinol 300 MG Oral Tablet;  Therapy: (Recorded:08Oct2009) to Recorded 2. Amitriptyline HCl - 10 MG Oral Tablet; TAKE 1 TABLET AT BEDTIME;  Therapy: 23Dec2015 to (Evaluate:17Nov2016)  Requested for: 23Dec2015; Last  Rx:23Dec2015 Ordered 3. AmLODIPine Besylate 5 MG Oral Tablet;  Therapy: (Recorded:16Jul2012) to Recorded 4. Azithromycin 250 MG Oral Tablet;  Therapy: (Recorded:18Dec2015) to Recorded 5. Carvedilol 12.5 MG Oral Tablet;  Therapy: (Recorded:16Jul2012) to Recorded 6. Finasteride 5 MG Oral Tablet; Take 1 tablet daily;  Therapy: 21Aug2012 to (Evaluate:15Dec2014)  Requested for: 15Jan2015; Last  Rx:16Oct2014 Ordered 7. Hydrochlorothiazide 25 MG Oral Tablet;  Therapy: (Recorded:18Dec2015) to Recorded 8. Hydrocodone-Acetaminophen 7.5-325 MG Oral Tablet;  Therapy: (Recorded:18Dec2015) to Recorded 9. Lisinopril 10 MG Oral Tablet;  Therapy: (Recorded:16Jul2012) to Recorded 10. Nitrostat 0.4 MG Sublingual Tablet Sublingual;   Therapy: 10Apr2013 to Recorded 11. Phenazopyridine HCl - 200 MG Oral Tablet; TAKE 1 TABLET 3 TIMES DAILY AS NEEDED   FOR PAIN;   Therapy: 18Dec2015 to (Evaluate:07Jan2016)  Requested for: 18Dec2015; Last   Rx:18Dec2015 Ordered 12. Polyethylene Glycol 3350 Oral Powder;  Therapy: (Recorded:18Dec2015) to Recorded 13. Pravastatin Sodium 40 MG Oral Tablet;   Therapy: 22May2013 to Recorded 14. Propylthiouracil 50 MG Oral Tablet;   Therapy: (Recorded:08Oct2009) to Recorded 15. Temazepam 30 MG Oral Capsule;   Therapy: (Recorded:18Dec2015) to Recorded  Allergies Medication  1. No Known Drug Allergies  Family History Problems  1. Family history of Death In The Family Father 2. Family history of Death In The Family Mother 3. Family history of Family Health Status Number Of Children   1 son  ([id number]) & 1  daughter ([id number]) 4. No pertinent family history : Mother  Social History Problems  1. Alcohol Use   5 per day 2. Caffeine Use   1 per day 3. Former smoker [phone](Z87.891) 4. Marital History - Currently Married 5. Occupation:   yardwork 6. Tobacco Use   smoked 2 ppd for 30 yrs & quit 10 yrs ago  Vitals Vital Signs [Data Includes: Last 1 Day]  Recorded: 27Jan2016 01:58PM  Blood Pressure: 130 / 79 Temperature: 97.7 F Heart Rate: 67  Results/Data Urine [Data Includes: Last 1 Day]   27Jan2016  COLOR YELLOW   APPEARANCE CLEAR   SPECIFIC GRAVITY 1.025   pH 5.0   GLUCOSE NEG mg/dL  BILIRUBIN NEG   KETONE NEG mg/dL  BLOOD LARGE   PROTEIN NEG mg/dL  UROBILINOGEN 0.2 mg/dL  NITRITE NEG   LEUKOCYTE ESTERASE TRACE   SQUAMOUS EPITHELIAL/HPF RARE   WBC 3-6 WBC/hpf  RBC 21-50 RBC/hpf  BACTERIA RARE   CRYSTALS NONE SEEN   CASTS NONE SEEN    Procedure KUB today comparison to CT 2015, CT 2012 in 2009-reviewed all these images, findings: The bones appeared normal. The bowel gas pattern appear normal. Large right renal pelvic stone. Left lower pole stone. No obvious stones over the course of the ureters or bladder.     Assessment Assessed  1. Nephrolithiasis (N20.0) 2. Bacteriuria, asymptomatic (N39.0)  He had a normal exam today. The penis was nontender on exam. It appeared normal. Prostate was normal. Abdomen was soft and nontender.   Plan Bacteriuria, asymptomatic  1. URINE CULTURE; Status:Hold For - Specimen/Data Collection,Appointment; Requested  for:27Jan2016;  Health Maintenance  2. UA With REFLEX; [Do Not Release]; Status:Resulted - Requires Verification;   Done:  27Jan2016 01:28PM Nephrolithiasis  3. Follow-up Schedule Surgery Office  Follow-up  Status: Hold For - Appointment   Requested for: 27Jan2016  Discussion/Summary       Pelvic pain-much improved now that he is back on Elavil.    Nephrolithiasis-we discussed again the nature risks benefits and  alternatives to right PCNL. Discussed risk of bleeding infection colon injury, need for staged procedure among others. Discussed stent and f/u. We'll get IR to place nephroureteral catheter hopefully in the middle or upper pole access.    BPH-patient did want to check his PSA and I added to his preop labs.     Signatures Electronically signed by : Jerilee FieldMatthew Izeah Vossler, M.D.; Oct 24 2014  2:39PM EST   Addendum: urine Cx negative

## 2014-11-22 NOTE — Op Note (Signed)
Preoperative diagnosis: Right UPJ stone Postoperative diagnosis: Same  Procedure: Right extracorporeal shockwave lithotripsy - staged   Patient with prior right stent.  Please see [facility] operative [facility].  I discussed postoperatively with the patient and his son that we may need to again consider PCNL if the stone proved to be shock resistant.  I was a bit surprised based on the preoperative HU it didn't break up any more.

## 2014-11-22 NOTE — Interval H&P Note (Signed)
History and Physical Interval Note:  11/22/2014 7:44 AM  Phillip Wong  has presented today for surgery, with the diagnosis of RIGHT RENAL STONE. He underwent right ureteral stent Nov 20, 2014. The various methods of treatment have been discussed with the patient and family. After consideration of risks, benefits and other options for treatment, the patient has consented to  Procedure(s): RIGHT EXTRACORPOREAL SHOCK WAVE LITHOTRIPSY (ESWL) (Right) as a surgical intervention .  The patient's history has been reviewed, patient examined, no change in status, stable for surgery.  I have reviewed the patient's chart and labs.  Questions were answered to the patient's satisfaction.  Pt stent patient much better today (bladder and right flank). No fever or chills. Discussed staged approach.    Trinitee Horgan

## 2014-12-13 ENCOUNTER — Encounter (HOSPITAL_COMMUNITY): Payer: Self-pay | Admitting: *Deleted

## 2014-12-13 ENCOUNTER — Other Ambulatory Visit: Payer: Self-pay | Admitting: Urology

## 2014-12-14 ENCOUNTER — Encounter (HOSPITAL_COMMUNITY): Payer: Self-pay | Admitting: *Deleted

## 2014-12-17 NOTE — H&P (Signed)
History of Present Illness                               F/u - PCP Dr. Clarene Duke       1-BPH - Aug 2012 - cysto Aug 2012 showed trilobar hypertrophy and a median lobe.   -Dec 2015 - normal DRE    2 - Nephrolithiasis - on surveillance, pt noted he has known about the kidney stone for "15 years" and did not want them treated.   -Aug 2012 - CT Abd/Pelvis Aug 8,2012 at [facility] report: 22 mm right renal pelvic stone with minimal hydronephrosis. A LLP 17 mm stone, no hydronephrosis.There were no ureteral stones. Small bilateral inguinal hernias containing fat were noted. No pelvic stones, masses or LAD. The prostate was enlarged and bladder mildly thickened.   -Sep 2015 - CT a/p - 2.4 cm right renal pelvic stone and a 13 mm left lower pole   -Dec 2015 ruq pain, KUB - a large right renal pelvic stone (29 mm) and a left lower pole stone. Possibly slightly bigger.     3-pelvic pain - Jul 2012 - for 5 months patient has had bilateral testicle and inguinal pain. This concerns him most. Tamsulosin has made no difference. He also has frequent urination and nocturia x 3. He was started on Elavil and finasteride in 2012.   -Dec 2015 penile pain, stopped Elavil.     4- MH - Aug 2012 - cysto and CT benign (bph, stones)      Mar 2016 interval hx  Patient returns following staged treatment of a large right renal colic stone. He opted for endoscopic management. We placed a stent and perform shockwave. Today KUB shows good fragmentation of the stone but as expected there a lot of fragments left but it fallen into the lower pole and upper ureter. Patient continues to have some frequency but he felt well and had no fever. Some mild dysuria as well.                 Past Medical History Problems  1. History of Arthritis 2. History of Gout (M10.9) 3. History of depression (Z86.59) 4. History of hypertension (Z86.79) 5. History of kidney stones (Z87.442) 6. History of  Murmur (R01.1)  Surgical History Problems  1. History of Cystoscopy With Insertion Of Ureteral Stent Right 2. History of Lithotripsy 3. History of Lung Surgery  Current Meds 1. Allopurinol 300 MG Oral Tablet;  Therapy: (Recorded:08Oct2009) to Recorded 2. Amitriptyline HCl - 10 MG Oral Tablet; TAKE 1 TABLET AT BEDTIME;  Therapy: 23Dec2015 to (Evaluate:17Nov2016)  Requested for: 23Dec2015; Last  Rx:23Dec2015 Ordered 3. AmLODIPine Besylate 5 MG Oral Tablet;  Therapy: (Recorded:16Jul2012) to Recorded 4. Azithromycin 250 MG Oral Tablet;  Therapy: (Recorded:18Dec2015) to Recorded 5. Carvedilol 12.5 MG Oral Tablet;  Therapy: (Recorded:16Jul2012) to Recorded 6. Finasteride 5 MG Oral Tablet; Take 1 tablet daily;  Therapy: 21Aug2012 to (Evaluate:15Dec2014)  Requested for: 15Jan2015; Last  Rx:16Oct2014 Ordered 7. Hydrochlorothiazide 25 MG Oral Tablet;  Therapy: (Recorded:18Dec2015) to Recorded 8. Hydrocodone-Acetaminophen 7.5-325 MG Oral Tablet;  Therapy: (Recorded:18Dec2015) to Recorded 9. Lisinopril 10 MG Oral Tablet;  Therapy: (Recorded:16Jul2012) to Recorded 10. Nitrostat 0.4 MG Sublingual Tablet Sublingual;   Therapy: 10Apr2013 to Recorded 11. Polyethylene Glycol 3350 Oral Powder;   Therapy: (Recorded:18Dec2015) to Recorded 12. Pravastatin Sodium 40 MG Oral Tablet;   Therapy: 22May2013 to Recorded 13. Propylthiouracil 50 MG Oral Tablet;   Therapy: (  Recorded:08Oct2009) to Recorded 14. Tamsulosin HCl - 0.4 MG Oral Capsule; TAKE 1 CAPSULE Daily;   Therapy: 24Feb2016 to (Evaluate:18Feb2017)  Requested for: 24Feb2016; Last   Rx:24Feb2016 Ordered 15. Temazepam 30 MG Oral Capsule;   Therapy: (Recorded:18Dec2015) to Recorded  Allergies Medication  1. No Known Drug Allergies  Family History Problems  1. Family history of Death In The Family Father 2. Family history of Death In The Family Mother 3. Family history of Family Health Status Number Of Children   1 son  ([id number]) & 1  daughter (4333) 4. No pertinent family history : Mother  Social History Problems  1. Alcohol Use   5 per day 2. Caffeine Use   1 per day 3. Former smoker [phone](Z87.891) 4. Marital History - Currently Married 5. Occupation:   yardwork 6. Tobacco Use   smoked 2 ppd for 30 yrs & quit 10 yrs ago  Vitals Vital Signs [Data Includes: Last 1 Day]  Recorded: 16Mar2016 12:00PM  Blood Pressure: 110 / 63 Temperature: 97.3 F Heart Rate: 71  Physical Exam Constitutional: Well nourished and well developed . No acute distress.  Neuro/Psych:. Mood and affect are appropriate.    Results/Data Urine [Data Includes: Last 1 Day]   16Mar2016  COLOR YELLOW   APPEARANCE CLOUDY   SPECIFIC GRAVITY 1.025   pH 5.5   GLUCOSE NEG mg/dL  BILIRUBIN NEG   KETONE NEG mg/dL  BLOOD LARGE   PROTEIN 30 mg/dL  UROBILINOGEN 0.2 mg/dL  NITRITE NEG   LEUKOCYTE ESTERASE SMALL   SQUAMOUS EPITHELIAL/HPF RARE   WBC 3-6 WBC/hpf  RBC TNTC RBC/hpf  BACTERIA FEW   CRYSTALS NONE SEEN   CASTS NONE SEEN    Procedure      KUB today comparison to prior KUB and CT 2015, CT 2012 in 2009:A 2 cm string of fragments in the right lower pole and some fragments along the proximal origin of the stent. Stent in good position. The bones and the bowel gas pattern appeared normal.     Assessment Assessed  1. Bacteriuria, asymptomatic (N39.0) 2. Nephrolithiasis (N20.0)  Plan Health Maintenance  1. UA With REFLEX; [Do Not Release]; Status:Complete;   Done: 16Mar2016 11:36AM Nephrolithiasis  2. Follow-up Schedule Surgery Office  Follow-up  Status: Hold For - Appointment   Requested for: 16Mar2016 3. URINE CULTURE; Status:Hold For - Specimen/Data Collection,Appointment; Requested  for:16Mar2016;   Discussion/Summary Right renal pelvic stone-I discussed with the patient the nature risk and benefits of simply removing the stent today, repeating shockwave lithotripsy or proceeding with ureteroscopy holmium laser  lithotripsy and stone basket extraction and stent replacement. He said he would do anything except "make a hole in his back". We'll proceed with    ureteroscopy and again discussed he may need stage procedure as well as risk of ureteral injury among others.       Signatures Electronically signed by : Jerilee FieldMatthew Tychelle Purkey, M.D.; Dec 12 2014  1:10PM EST

## 2014-12-18 ENCOUNTER — Encounter (HOSPITAL_COMMUNITY)
Admission: RE | Disposition: A | Discharge status: Home / Self Care | Payer: Self-pay | Source: Ambulatory Visit | Attending: Urology

## 2014-12-18 ENCOUNTER — Ambulatory Visit (HOSPITAL_COMMUNITY): Payer: Medicare Other | Admitting: Anesthesiology

## 2014-12-18 ENCOUNTER — Encounter (HOSPITAL_COMMUNITY): Payer: Self-pay | Admitting: *Deleted

## 2014-12-18 ENCOUNTER — Ambulatory Visit (HOSPITAL_COMMUNITY)
Admission: RE | Admit: 2014-12-18 | Discharge: 2014-12-18 | Disposition: A | Discharge status: Home / Self Care | Payer: Medicare Other | Source: Ambulatory Visit | Attending: Urology | Admitting: Urology

## 2014-12-18 DIAGNOSIS — I10 Essential (primary) hypertension: Secondary | ICD-10-CM | POA: Insufficient documentation

## 2014-12-18 DIAGNOSIS — N200 Calculus of kidney: Secondary | ICD-10-CM

## 2014-12-18 DIAGNOSIS — F1099 Alcohol use, unspecified with unspecified alcohol-induced disorder: Secondary | ICD-10-CM | POA: Diagnosis not present

## 2014-12-18 DIAGNOSIS — N201 Calculus of ureter: Secondary | ICD-10-CM | POA: Diagnosis present

## 2014-12-18 DIAGNOSIS — F329 Major depressive disorder, single episode, unspecified: Secondary | ICD-10-CM | POA: Diagnosis not present

## 2014-12-18 DIAGNOSIS — E0590 Thyrotoxicosis, unspecified without thyrotoxic crisis or storm: Secondary | ICD-10-CM | POA: Diagnosis not present

## 2014-12-18 DIAGNOSIS — M109 Gout, unspecified: Secondary | ICD-10-CM | POA: Diagnosis not present

## 2014-12-18 DIAGNOSIS — Z87891 Personal history of nicotine dependence: Secondary | ICD-10-CM | POA: Diagnosis not present

## 2014-12-18 HISTORY — PX: CYSTOSCOPY WITH URETEROSCOPY, STONE BASKETRY AND STENT PLACEMENT: SHX6378

## 2014-12-18 LAB — CBC
HEMATOCRIT: 41.1 % (ref 39.0–52.0)
Hemoglobin: 14 g/dL (ref 13.0–17.0)
MCH: 31.8 pg (ref 26.0–34.0)
MCHC: 34.1 g/dL (ref 30.0–36.0)
MCV: 93.4 fL (ref 78.0–100.0)
Platelets: 213 10*3/uL (ref 150–400)
RBC: 4.4 MIL/uL (ref 4.22–5.81)
RDW: 13.4 % (ref 11.5–15.5)
WBC: 6.1 10*3/uL (ref 4.0–10.5)

## 2014-12-18 LAB — BASIC METABOLIC PANEL
Anion gap: 9 (ref 5–15)
BUN: 11 mg/dL (ref 6–23)
CALCIUM: 9 mg/dL (ref 8.4–10.5)
CO2: 26 mmol/L (ref 19–32)
Chloride: 103 mmol/L (ref 96–112)
Creatinine, Ser: 0.75 mg/dL (ref 0.50–1.35)
GFR calc Af Amer: >90 mL/min (ref >90)
GLUCOSE: 140 mg/dL — AB (ref 70–99)
Potassium: 4.1 mmol/L (ref 3.5–5.1)
SODIUM: 138 mmol/L (ref 135–145)

## 2014-12-18 SURGERY — CYSTOSCOPY, WITH CALCULUS MANIPULATION OR REMOVAL
Anesthesia: General | Site: Ureter | Laterality: Right

## 2014-12-18 MED ORDER — CEFAZOLIN SODIUM-DEXTROSE 2-3 GM-% IV SOLR
INTRAVENOUS | Status: AC
  Filled 2014-12-18: qty 50

## 2014-12-18 MED ORDER — CEFAZOLIN SODIUM-DEXTROSE 2-3 GM-% IV SOLR
2.0000 g | INTRAVENOUS | Status: AC
  Administered 2014-12-18: 2 g via INTRAVENOUS

## 2014-12-18 MED ORDER — KETOROLAC TROMETHAMINE 30 MG/ML IJ SOLN
INTRAMUSCULAR | Status: DC | PRN
  Administered 2014-12-18: 30 mg via INTRAVENOUS

## 2014-12-18 MED ORDER — PROPOFOL 10 MG/ML IV BOLUS
INTRAVENOUS | Status: AC
  Filled 2014-12-18: qty 20

## 2014-12-18 MED ORDER — PROPOFOL 10 MG/ML IV BOLUS
INTRAVENOUS | Status: DC | PRN
  Administered 2014-12-18: 150 mg via INTRAVENOUS

## 2014-12-18 MED ORDER — BELLADONNA ALKALOIDS-OPIUM 16.2-60 MG RE SUPP
RECTAL | Status: AC
  Filled 2014-12-18: qty 1

## 2014-12-18 MED ORDER — LIDOCAINE HCL 2 % EX GEL
CUTANEOUS | Status: AC
  Filled 2014-12-18: qty 10

## 2014-12-18 MED ORDER — EPHEDRINE SULFATE 50 MG/ML IJ SOLN
INTRAMUSCULAR | Status: DC | PRN
Start: 2014-12-18 — End: 2014-12-18
  Administered 2014-12-18 (×2): 5 mg via INTRAVENOUS

## 2014-12-18 MED ORDER — LIDOCAINE HCL (CARDIAC) 20 MG/ML IV SOLN
INTRAVENOUS | Status: AC
  Filled 2014-12-18: qty 5

## 2014-12-18 MED ORDER — FENTANYL CITRATE 0.05 MG/ML IJ SOLN
INTRAMUSCULAR | Status: AC
  Filled 2014-12-18: qty 2

## 2014-12-18 MED ORDER — ONDANSETRON HCL 4 MG/2ML IJ SOLN
INTRAMUSCULAR | Status: DC | PRN
  Administered 2014-12-18: 4 mg via INTRAVENOUS

## 2014-12-18 MED ORDER — DEXAMETHASONE SODIUM PHOSPHATE 10 MG/ML IJ SOLN
INTRAMUSCULAR | Status: AC
  Filled 2014-12-18: qty 1

## 2014-12-18 MED ORDER — LACTATED RINGERS IV SOLN
INTRAVENOUS | Status: DC | PRN
  Administered 2014-12-18 (×2): via INTRAVENOUS

## 2014-12-18 MED ORDER — OXYCODONE HCL 5 MG PO TABS: 5.0000 mg | ORAL_TABLET | ORAL | Status: DC | PRN

## 2014-12-18 MED ORDER — ONDANSETRON HCL 4 MG/2ML IJ SOLN
INTRAMUSCULAR | Status: AC
  Filled 2014-12-18: qty 2

## 2014-12-18 MED ORDER — LIDOCAINE HCL 2 % EX GEL
CUTANEOUS | Status: DC | PRN
  Administered 2014-12-18: 1 via TOPICAL

## 2014-12-18 MED ORDER — ACETAMINOPHEN 10 MG/ML IV SOLN
1000.0000 mg | Freq: Once | INTRAVENOUS | Status: AC
  Administered 2014-12-18: 1000 mg via INTRAVENOUS
  Filled 2014-12-18: qty 100

## 2014-12-18 MED ORDER — LIDOCAINE HCL (CARDIAC) 20 MG/ML IV SOLN
INTRAVENOUS | Status: DC | PRN
  Administered 2014-12-18: 100 mg via INTRAVENOUS

## 2014-12-18 MED ORDER — MIDAZOLAM HCL 2 MG/2ML IJ SOLN
INTRAMUSCULAR | Status: AC
  Filled 2014-12-18: qty 2

## 2014-12-18 MED ORDER — SODIUM CHLORIDE 0.9 % IJ SOLN
INTRAMUSCULAR | Status: AC
  Filled 2014-12-18: qty 10

## 2014-12-18 MED ORDER — FENTANYL CITRATE 0.05 MG/ML IJ SOLN
INTRAMUSCULAR | Status: DC | PRN
  Administered 2014-12-18 (×5): 25 ug via INTRAVENOUS
  Administered 2014-12-18: 50 ug via INTRAVENOUS
  Administered 2014-12-18: 25 ug via INTRAVENOUS

## 2014-12-18 MED ORDER — MEPERIDINE HCL 50 MG/ML IJ SOLN: 6.2500 mg | INTRAMUSCULAR | Status: DC | PRN

## 2014-12-18 MED ORDER — EPHEDRINE SULFATE 50 MG/ML IJ SOLN
INTRAMUSCULAR | Status: AC
  Filled 2014-12-18: qty 1

## 2014-12-18 MED ORDER — SODIUM CHLORIDE 0.9 % IR SOLN
Status: DC | PRN
  Administered 2014-12-18: 1000 mL via INTRAVESICAL

## 2014-12-18 MED ORDER — FENTANYL CITRATE 0.05 MG/ML IJ SOLN: 25.0000 ug | INTRAMUSCULAR | Status: DC | PRN

## 2014-12-18 MED ORDER — BELLADONNA ALKALOIDS-OPIUM 16.2-60 MG RE SUPP
RECTAL | Status: DC | PRN
  Administered 2014-12-18: 1 via RECTAL

## 2014-12-18 MED ORDER — PROMETHAZINE HCL 25 MG/ML IJ SOLN: 6.2500 mg | INTRAMUSCULAR | Status: DC | PRN

## 2014-12-18 MED ORDER — DEXAMETHASONE SODIUM PHOSPHATE 10 MG/ML IJ SOLN
INTRAMUSCULAR | Status: DC | PRN
  Administered 2014-12-18: 10 mg via INTRAVENOUS

## 2014-12-18 MED ORDER — MIDAZOLAM HCL 5 MG/5ML IJ SOLN
INTRAMUSCULAR | Status: DC | PRN
  Administered 2014-12-18: 2 mg via INTRAVENOUS

## 2014-12-18 MED ORDER — KETOROLAC TROMETHAMINE 30 MG/ML IJ SOLN
INTRAMUSCULAR | Status: AC
  Filled 2014-12-18: qty 1

## 2014-12-18 SURGICAL SUPPLY — 19 items
BAG URO CATCHER STRL LF (DRAPE) ×4 IMPLANT
BASKET LASER NITINOL 1.9FR (BASKET) ×4 IMPLANT
BASKET STNLS GEMINI 4WIRE 3FR (BASKET) IMPLANT
BASKET STONE NCOMPASS (UROLOGICAL SUPPLIES) ×4 IMPLANT
CATH INTERMIT  6FR 70CM (CATHETERS) ×4 IMPLANT
CATH URET 5FR 28IN CONE TIP (BALLOONS)
CATH URET 5FR 70CM CONE TIP (BALLOONS) IMPLANT
EXTRACTOR STONE NITINOL NGAGE (UROLOGICAL SUPPLIES) ×4 IMPLANT
GLOVE BIOGEL M STRL SZ7.5 (GLOVE) ×4 IMPLANT
GOWN STRL REUS W/TWL LRG LVL3 (GOWN DISPOSABLE) ×4 IMPLANT
GOWN STRL REUS W/TWL XL LVL3 (GOWN DISPOSABLE) ×4 IMPLANT
GUIDEWIRE ANG ZIPWIRE 038X150 (WIRE) ×4 IMPLANT
GUIDEWIRE STR DUAL SENSOR (WIRE) ×4 IMPLANT
MANIFOLD NEPTUNE II (INSTRUMENTS) ×4 IMPLANT
PACK CYSTO (CUSTOM PROCEDURE TRAY) ×4 IMPLANT
SHEATH ACCESS URETERAL 38CM (SHEATH) ×4 IMPLANT
STENT CONTOUR 6FRX26X.038 (STENTS) ×4 IMPLANT
TUBING CONNECTING 10 (TUBING) ×3 IMPLANT
TUBING CONNECTING 10' (TUBING) ×1

## 2014-12-18 NOTE — Anesthesia Preprocedure Evaluation (Signed)
Anesthesia Evaluation  Patient identified by MRN, date of birth, ID band Patient awake    Reviewed: Allergy & Precautions, NPO status , Patient's Chart, lab work & pertinent test results  Airway Mallampati: II  TM Distance: >3 FB Neck ROM: Full    Dental no notable dental hx. (+) Edentulous Upper, Edentulous Lower   Pulmonary former smoker,  breath sounds clear to auscultation  Pulmonary exam normal       Cardiovascular hypertension, Pt. on medications Rhythm:Regular Rate:Normal     Neuro/Psych negative neurological ROS  negative psych ROS   GI/Hepatic negative GI ROS, Neg liver ROS,   Endo/Other  Hyperthyroidism   Renal/GU Renal disease  negative genitourinary   Musculoskeletal  (+) Arthritis -,   Abdominal   Peds negative pediatric ROS (+)  Hematology negative hematology ROS (+)   Anesthesia Other Findings Kidney stone  Reproductive/Obstetrics negative OB ROS                             Anesthesia Physical  Anesthesia Plan  ASA: II  Anesthesia Plan: General   Post-op Pain Management:    Induction: Intravenous  Airway Management Planned: LMA  Additional Equipment:   Intra-op Plan:   Post-operative Plan: Extubation in OR  Informed Consent: I have reviewed the patients History and Physical, chart, labs and discussed the procedure including the risks, benefits and alternatives for the proposed anesthesia with the patient or authorized representative who has indicated his/her understanding and acceptance.   Dental advisory given  Plan Discussed with: CRNA  Anesthesia Plan Comments:         Anesthesia Quick Evaluation

## 2014-12-18 NOTE — Op Note (Signed)
Preoperative diagnosis: Right renal stones, right ureteral stones Postoperative diagnosis: Same  Procedure: Cystoscopy, right ureteroscopy, stone basket extraction, Right ureteral stent placement Exam under anesthesia   Surgeon: Mena GoesEskridge  Anesthesia: General  Indications for procedure: 69 year old with a large right renal pelvic stone status post shockwave lithotripsy.  Significant stone burden remaining was brought for ureteroscopy.  Findings: On cystoscopy there were multiple small stone fragments in the bladder. On ureteroscopy there were copious amounts of tiny 1-2 mm fragments.  No large fragments noted.  On exam under anesthesia the penis is circumcised and without lesions.  The testicles were descended bilaterally and palpably normal.  On digital rectal exam the prostate was mildly enlarged but smooth without hard area or nodule.  All landmarks preserved.  A B&O suppository was placed.  Description of procedure: After consent was obtained patient brought to the operating room.  After adequate anesthesia he was placed in lithotomy position and prepped and draped in the usual sterile fashion.  A timeout was performed to confirm the patient and procedure.  The cystoscope was passed per urethra and the stent grasped and removed through the urethral meatus.A sensor wire was advanced and coiled in the collecting system and the stent removed.  Adjacent to the wire semirigid ureteroscope was advanced.  In the distal and mid ureter there were a few small fragments.  These were removed sequentially.  There were pockets of fragments in the proximal ureter.  These were small 1-2 mm fragments.  They were sequentially removed.  This took quite some time.    The cystoscope was repassed to drain the bladder.  I then passed a Glidewire and attempted to passA ureteral access sheath but it would not progress over the iliacs.  Therefore the semirigid ureteroscope was readvanced and more small 1-2 mm  fragments were cleared from the ureter.  Again it appeared as most of the small fragments around the proximal ureter and the renal pelvis therefore I passed the Glidewire again and now the ureteral access sheath advanced without difficulty.  The dual channel digital ureteroscope was advanced but even though the patient had had a stent in the proximal ureter was still a bit narrowed.  Therefore I switched to the single channel ureteroscope which was 1 [place] smaller and this provided better access.  This did limit fluid flow.  I switched from the ZeroTip basket to the encompass which helped suite without a lot of fragments.I was able to reach the UPJ.  The ureter was clear.  In the kidney and there were multiple small fragments.  Visualization was poor with a smaller ureteroscope.  I pulled out a sampling of fragments without difficulty and on inspection the largest was 2-3 mm.  Therefore I decided to leave the stent given the sequential trip side taken to leave it without a string.  I'll have the patient follow up in the office and to 3 weeks with another KUB and then likely remove the stent.  The ureter was carefully inspected noting the UPJ and the ureter to be normal and without stone fragment.  The sheath and the ureteroscope was backed out together and again the ureter was noted to be stone free and without injury.  The cystoscope was passed per urethra to drain the bladder.  There were many many tiny fragments in the bladder which were irrigated.  The draped already contained many many small fragments.  The remaining Glidewire was backloaded on the cystoscope and a 6 x 26 cm stent was  advanced.  The wire was removed a good coil seen in the kidney and a good coil in the bladder.  The bladder was drained and the scope removed.  Some lidocaine jelly was instilled per urethra.  A good exam under anesthesia placed a B&O suppository.  Patient was awakened taken to recovery room in stable condition.  Complications:  None Blood loss: Minimal Specimens: Stone fragments to office lab  Drains: 6 x 26 cm right ureteral stent

## 2014-12-18 NOTE — Transfer of Care (Signed)
Immediate Anesthesia Transfer of Care Note  Patient: Phillip Wong  Procedure(s) Performed: Procedure(s): CYSTOSCOPY WITH URETEROSCOPY, STONE BASKETRY AND STENT PLACEMENT (Right)  Patient Location: PACU  Anesthesia Type:General  Level of Consciousness: awake and alert   Airway & Oxygen Therapy: Patient Spontanous Breathing and Patient connected to face mask oxygen  Post-op Assessment: Report given to RN and Post -op Vital signs reviewed and stable  Post vital signs: Reviewed and stable  Last Vitals:  Filed Vitals:   12/18/14 0703  BP: 140/76  Pulse: 86  Temp: 36.4 C  Resp: 16    Complications: No apparent anesthesia complications

## 2014-12-18 NOTE — Discharge Instructions (Signed)
Ureteral Stent Implantation, Care After °Refer to this sheet in the next few weeks. These instructions provide you with information on caring for yourself after your procedure. Your health care provider may also give you more specific instructions. Your treatment has been planned according to current medical practices, but problems sometimes occur. Call your health care provider if you have any problems or questions after your procedure. °WHAT TO EXPECT AFTER THE PROCEDURE °You should be back to normal activity within 48 hours after the procedure. Nausea and vomiting may occur and are commonly the result of anesthesia. °It is common to experience sharp pain in the back or lower abdomen and penis with voiding. This is caused by movement of the ends of the stent with the act of urinating. It usually goes away within minutes after you have stopped urinating. °HOME CARE INSTRUCTIONS °Make sure to drink plenty of fluids. You may have small amounts of bleeding, causing your urine to be red. This is normal. Certain movements may trigger pain or a feeling that you need to urinate. You may be given medicines to prevent infection or bladder spasms. Be sure to take all medicines as directed. Only take over-the-counter or prescription medicines for pain, discomfort, or fever as directed by your health care provider. Do not take aspirin, as this can make bleeding worse. °Your stent will be left in until the blockage is resolved. This may take 2 weeks or longer, depending on the reason for stent implantation. You may have an X-ray exam to make sure your ureter is open and that the stent has not moved out of position (migrated). The stent can be removed by your health care provider in the office. Medicines may be given for comfort while the stent is being removed. Be sure to keep all follow-up appointments so your health care provider can check that you are healing properly. °SEEK MEDICAL CARE IF: °· You experience increasing  pain. °· Your pain medicine is not working. °SEEK IMMEDIATE MEDICAL CARE IF: °· Your urine is dark red or has blood clots. °· You are leaking urine (incontinent). °· You have a fever, chills, feeling sick to your stomach (nausea), or vomiting. °· Your pain is not relieved by pain medicine. °· The end of the stent comes out of the urethra. °· You are unable to urinate. °Document Released: 05/17/2013 Document Revised: 09/19/2013 Document Reviewed: 05/17/2013 °[employer]® Patient Information ©2015 [employer]. This information is not intended to replace advice given to you by your health care provider. Make sure you discuss any questions you have with your health care provider. ° °

## 2014-12-18 NOTE — Interval H&P Note (Signed)
History and Physical Interval Note:  12/18/2014 9:58 AM  Phillip CruiseJames G Felten  has presented today for surgery, with the diagnosis of RIGHT NEPHROLITHIALSIS  The various methods of treatment have been discussed with the patient and family. After consideration of risks, benefits and other options for treatment, the patient has consented to  Procedure(s): RIGHT /URETEROSCOPY/HOLMIUM LASER/STENT PLACEMENT (Right) as a surgical intervention .  The patient's history has been reviewed, patient examined, no change in status, stable for surgery.  I have reviewed the patient's chart and labs. Pt says "finally" without dysuria. No fever. Urine cx office neg. Discussed significant stone burden, possible staged procedure.  Questions were answered to the patient's satisfaction.     Andersen Iorio

## 2014-12-18 NOTE — Anesthesia Postprocedure Evaluation (Signed)
  Anesthesia Post-op Note  Patient: Phillip CruiseJames G Wong  Procedure(s) Performed: Procedure(s) (LRB): CYSTOSCOPY WITH URETEROSCOPY, STONE BASKETRY AND STENT PLACEMENT (Right)  Patient Location: PACU  Anesthesia Type: General  Level of Consciousness: awake and alert   Airway and Oxygen Therapy: Patient Spontanous Breathing  Post-op Pain: mild  Post-op Assessment: Post-op Vital signs reviewed, Patient's Cardiovascular Status Stable, Respiratory Function Stable, Patent Airway and No signs of Nausea or vomiting  Last Vitals:  Filed Vitals:   12/18/14 1319  BP: 146/73  Pulse: 93  Temp: 36.7 C  Resp: 18    Post-op Vital Signs: stable   Complications: No apparent anesthesia complications

## 2014-12-19 ENCOUNTER — Encounter (HOSPITAL_COMMUNITY): Payer: Self-pay | Admitting: Urology

## 2015-03-05 ENCOUNTER — Encounter (HOSPITAL_BASED_OUTPATIENT_CLINIC_OR_DEPARTMENT_OTHER): Payer: Self-pay | Admitting: *Deleted

## 2015-03-05 ENCOUNTER — Other Ambulatory Visit: Payer: Self-pay | Admitting: Urology

## 2015-03-05 NOTE — Progress Notes (Signed)
NPO AFTER MN, PT VERBALIZED  UNDERSTANDING THIS INCLUDES NO DIP TOBACCO.  ARRIVE AT 0945.  NEEDS ISTAT. CURRENT EKG IN CHART AND EPIC. WILL TAKE AM MEDS DOS W/ SIPS OF WATER WITH EXCEPTION NO LISINOPRIL / HCTZ.  IF NEEDED MAY TAKE HYDROCODONE.

## 2015-03-11 NOTE — H&P (Signed)
Reason For Visit Seen today for a 6 week f/u.   Active Problems Problems  1. Bilateral kidney stones (N20.0)   Assessed By: Jetta Lout (Urology); Last Assessed: 27 Feb 2015 2. Calculus of right ureter (N20.1)   Assessed By: Jetta Lout (Urology); Last Assessed: 27 Feb 2015 3. Hydronephrosis, right (N13.30)   Assessed By: Jetta Lout (Urology); Last Assessed: 27 Feb 2015 4. 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 Estil Daft seen today for a 6 week f/u.     GU hx:  1-BPH - Aug 2012 - cysto Aug 2012 showed trilobar hypertrophy and a median lobe.   -Dec 2015 - normal DRE    2 - Nephrolithiasis - on surveillance, pt noted he has known about the kidney stone for "15 years" and did not want them treated.   -Aug 2012 - CT Abd/Pelvis Aug 8,2012 at [facility] report: 22 mm right renal pelvic stone with minimal hydronephrosis. A LLP 17 mm stone, no hydronephrosis.There were no ureteral stones. Small bilateral inguinal hernias containing fat were noted. No pelvic stones, masses or LAD. The prostate was enlarged and bladder mildly thickened.   -Sep 2015 - CT a/p - 2.4 cm right renal pelvic stone and a 13 mm left lower pole   -Dec 2015 ruq pain, KUB - a large right renal pelvic stone (29 mm) and a left lower pole stone. Possibly slightly bigger.     3-pelvic pain - Jul 2012 - for 5 months patient has had bilateral testicle and inguinal pain. This concerns him most. Tamsulosin has made no difference. He also has frequent urination and nocturia x 3. He was started on Elavil and finasteride in 2012.   -Dec 2015 penile pain, stopped Elavil.     4- MH - Aug 2012 - cysto and CT benign (bph, stones)    Apr 2016 interval hx  Patient returns following staged treatment of a large right renal stone. He underwent shockwave lithotripsy followed by ureteroscopy.  On ureteroscopy was impressed by the success of the lithotripsy. On ureteroscopy there were multiple fragments made ureteroscopy very difficult and was not able to find any significant fragments ureteroscopically.    He follows up to review KUB. He's been tolerating the stent much better than originally.    KUB shows some possible fragments along the proximal stent but this had a similar appearance on the left KUB and I wonder fits in the bowels. I recently did ureteroscopy and did not find any significant fragments. Of note the right kidney is mostly clear with a small fragment in the right lower pole.    June 2016 Interval Hx:  States today he feels the best he has in quite sometime. After stent removed continued to pass stone fragments for a few days but this eventually resolved. 48 hrs ago did have some mild right flank/back pain but felt this was his "typical back pain." Denies hematuria or dysuria.   Past Medical History Problems  1. History of Arthritis 2. History of Gout (M10.9) 3. History of depression (Z86.59) 4. History of hypertension (Z86.79) 5. History of kidney stones (Z87.442) 6. History of Murmur (R01.1)  Surgical History Problems  1. History of Cystoscopy With Insertion Of Ureteral Stent Right 2. History of Cystoscopy With Insertion Of Ureteral Stent Right 3. History of Cystoscopy With Ureteroscopy With Removal Of Calculus 4. History of Lithotripsy 5. History  of Lung Surgery  Current Meds 1. Allopurinol 300 MG Oral Tablet;  Therapy: (Recorded:08Oct2009) to Recorded 2. Amitriptyline HCl - 10 MG Oral Tablet; TAKE 1 TABLET AT BEDTIME;  Therapy: 23Dec2015 to (Evaluate:17Nov2016)  Requested for: 23Dec2015; Last  Rx:23Dec2015 Ordered 3. AmLODIPine Besylate 5 MG Oral Tablet;  Therapy: (Recorded:16Jul2012) to Recorded 4. Azithromycin 250 MG Oral Tablet;  Therapy: (Recorded:18Dec2015) to Recorded 5. Carvedilol 12.5 MG Oral Tablet;  Therapy: (Recorded:16Jul2012) to  Recorded 6. Finasteride 5 MG Oral Tablet; Take 1 tablet daily;  Therapy: 21Aug2012 to (Evaluate:15Dec2014)  Requested for: 15Jan2015; Last  Rx:16Oct2014 Ordered 7. Hydrochlorothiazide 25 MG Oral Tablet;  Therapy: (Recorded:18Dec2015) to Recorded 8. Hydrocodone-Acetaminophen 7.5-325 MG Oral Tablet;  Therapy: (Recorded:18Dec2015) to Recorded 9. Lisinopril 10 MG Oral Tablet;  Therapy: (Recorded:16Jul2012) to Recorded 10. Nitrostat 0.4 MG Sublingual Tablet Sublingual;   Therapy: 10Apr2013 to Recorded 11. Polyethylene Glycol 3350 Oral Powder;   Therapy: (Recorded:18Dec2015) to Recorded 12. Pravastatin Sodium 40 MG Oral Tablet;   Therapy: 22May2013 to Recorded 13. Propylthiouracil 50 MG Oral Tablet;   Therapy: (Recorded:08Oct2009) to Recorded 14. Tamsulosin HCl - 0.4 MG Oral Capsule; TAKE 1 CAPSULE Daily;   Therapy: 24Feb2016 to (Evaluate:18Feb2017)  Requested for: 24Feb2016; Last   Rx:24Feb2016 Ordered 15. Temazepam 30 MG Oral Capsule;   Therapy: (Recorded:18Dec2015) to Recorded  Allergies Medication  1. No Known Drug Allergies  Family History Problems  1. Family history of Death In The Family Father 2. Family history of Death In The Family Mother 3. Family history of Family Health Status Number Of Children   1 son  (89) & 1 daughter (35) 4. No pertinent family history : Mother  Social History Problems  1. Alcohol Use   5 per day 2. Caffeine Use   1 per day 3. Former smoker [phone]) 4. Marital History - Currently Married 5. Occupation:   yardwork 6. Tobacco Use   smoked 2 ppd for 30 yrs & quit 10 yrs ago  Review of Systems Genitourinary, constitutional, skin, eye, otolaryngeal, hematologic/lymphatic, cardiovascular, pulmonary, endocrine, musculoskeletal, gastrointestinal, neurological and psychiatric system(s) were reviewed and pertinent findings if present are noted and are otherwise negative.  Gastrointestinal: flank pain.  Musculoskeletal: back pain.     Vitals Vital Signs [Data Includes: Last 1 Day]  Recorded: 01Jun2016 04:01PM  Blood Pressure: 143 / 79 Temperature: 97.1 F Heart Rate: 72  Physical Exam Constitutional: Well nourished and well developed . No acute distress. The patient appears well hydrated.  Abdomen: The abdomen is obese. The abdomen is soft and nontender. No tenderness in the RLQ and no LLQ tenderness. No CVA tenderness.  Skin: Normal skin turgor.  Neuro/Psych:. Mood and affect are appropriate.    Results/Data  The following images/tracing/specimen were independently visualized:  KUB shows 2 stone fragments remaining in right lower pole. There is a faint stone seen at UPJ. RUS: right kidney: 13.21 X 1.58 X 4,71 X 5.80 cm. Moderate hydronephrosis with dilated proximal ureter. Several stones noted in lower pole. Ureter shows at least 2-3 stones proximally. Left kidney: 12.72 X 1.30 X 4.87 X 5.60 cm. Renal pelvis cyst: 2.36 X 2.10 X 1.83 cm. Several lower pole stones noted.  The following clinical lab reports were reviewed:  UA: WBC 7-10. Will culture.  PVR: Ultrasound PVR 4.10 ml. Selected Results  URINE CULTURE 01Jun2016 04:22PM Myrtie Soman, Diane  SOURCE : CLEAN CATCH SPECIMEN TYPE: URINE   Test Name Result Flag Reference  CULTURE, URINE Culture, Urine    ===== COLONY COUNT: =====  NO GROWTH  FINAL REPORT: NO GROWTH   UA With REFLEX 01Jun2016 02:40PM Jerilee Field  SPECIMEN TYPE: CLEAN CATCH   Test Name Result Flag Reference  COLOR YELLOW  YELLOW  APPEARANCE CLEAR  CLEAR  SPECIFIC GRAVITY 1.020  1.005-1.030  pH 5.0  5.0-8.0  GLUCOSE NEG mg/dL  NEG  BILIRUBIN NEG  NEG  KETONE NEG mg/dL  NEG  BLOOD SMALL A NEG  PROTEIN NEG mg/dL  NEG  UROBILINOGEN 0.2 mg/dL  9.6-0.4  NITRITE NEG  NEG  LEUKOCYTE ESTERASE SMALL A NEG  SQUAMOUS EPITHELIAL/HPF RARE  RARE  WBC 7-10 WBC/hpf A <3  RBC 0-2 RBC/hpf  <3  BACTERIA RARE  RARE  CRYSTALS NONE SEEN  NONE SEEN  CASTS Hyaline casts noted  NONE SEEN  Other MUCUS  NOTED     Assessment Assessed  1. Hydronephrosis, right (N13.30) 2. Bilateral kidney stones (N20.0) 3. Calculus of right ureter (N20.1) 4. 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 KUB; Status:Complete;   Done: 01Jun2016 03:51PM  Dr. Mena Goes reviewed both KUB and RUS and does feel he has some remaining stones w/in right proximal ureter. He recommends that pt be scheduled for elective cystourethroscopy, R RPG, stone extraction, and possible double J stent placement. Risks and benefits discussed with pt and he understands that this is general anesthesia and again have stent post procedure.   Will culture urine. No ABX unless culture proven UTI  URINE CULTURE; Status:Resulted - Requires Verification;  Done: 01Jan0001 12:00AM Due:03Jun2016; Marked Important;Ordered; Today;  VWU:[id number]; Ordered YN:WGNFAO, Diane;   Quarry manager signed by : Jetta Lout, Dyann Ruddle; Mar 01 2015  7:19AM EST  Add: Urine Cx negative. I reviewed chart, notes, labs, images and discussed with NP Warden. I agree with her findings and plan.

## 2015-03-12 ENCOUNTER — Encounter (HOSPITAL_BASED_OUTPATIENT_CLINIC_OR_DEPARTMENT_OTHER): Payer: Self-pay | Admitting: *Deleted

## 2015-03-12 ENCOUNTER — Ambulatory Visit (HOSPITAL_BASED_OUTPATIENT_CLINIC_OR_DEPARTMENT_OTHER): Payer: Medicare Other | Admitting: Anesthesiology

## 2015-03-12 ENCOUNTER — Ambulatory Visit (HOSPITAL_BASED_OUTPATIENT_CLINIC_OR_DEPARTMENT_OTHER)
Admission: RE | Admit: 2015-03-12 | Discharge: 2015-03-12 | Disposition: A | Discharge status: Home / Self Care | Payer: Medicare Other | Source: Ambulatory Visit | Attending: Urology | Admitting: Urology

## 2015-03-12 ENCOUNTER — Other Ambulatory Visit: Payer: Self-pay

## 2015-03-12 ENCOUNTER — Encounter (HOSPITAL_BASED_OUTPATIENT_CLINIC_OR_DEPARTMENT_OTHER)
Admission: RE | Disposition: A | Discharge status: Home / Self Care | Payer: Self-pay | Source: Ambulatory Visit | Attending: Urology

## 2015-03-12 DIAGNOSIS — Z79899 Other long term (current) drug therapy: Secondary | ICD-10-CM | POA: Diagnosis not present

## 2015-03-12 DIAGNOSIS — M109 Gout, unspecified: Secondary | ICD-10-CM | POA: Diagnosis not present

## 2015-03-12 DIAGNOSIS — Z87891 Personal history of nicotine dependence: Secondary | ICD-10-CM | POA: Diagnosis not present

## 2015-03-12 DIAGNOSIS — N132 Hydronephrosis with renal and ureteral calculous obstruction: Secondary | ICD-10-CM | POA: Insufficient documentation

## 2015-03-12 DIAGNOSIS — F329 Major depressive disorder, single episode, unspecified: Secondary | ICD-10-CM | POA: Diagnosis not present

## 2015-03-12 DIAGNOSIS — E0590 Thyrotoxicosis, unspecified without thyrotoxic crisis or storm: Secondary | ICD-10-CM | POA: Diagnosis not present

## 2015-03-12 DIAGNOSIS — Z87442 Personal history of urinary calculi: Secondary | ICD-10-CM | POA: Diagnosis not present

## 2015-03-12 DIAGNOSIS — N400 Enlarged prostate without lower urinary tract symptoms: Secondary | ICD-10-CM | POA: Diagnosis not present

## 2015-03-12 DIAGNOSIS — N281 Cyst of kidney, acquired: Secondary | ICD-10-CM | POA: Diagnosis not present

## 2015-03-12 DIAGNOSIS — I10 Essential (primary) hypertension: Secondary | ICD-10-CM | POA: Diagnosis not present

## 2015-03-12 DIAGNOSIS — M1990 Unspecified osteoarthritis, unspecified site: Secondary | ICD-10-CM | POA: Insufficient documentation

## 2015-03-12 DIAGNOSIS — N200 Calculus of kidney: Secondary | ICD-10-CM | POA: Diagnosis present

## 2015-03-12 HISTORY — PX: CYSTOSCOPY WITH RETROGRADE PYELOGRAM, URETEROSCOPY AND STENT PLACEMENT: SHX5789

## 2015-03-12 HISTORY — PX: HOLMIUM LASER APPLICATION: SHX5852

## 2015-03-12 HISTORY — DX: Calculus of ureter: N20.1

## 2015-03-12 HISTORY — DX: Unspecified hydronephrosis: N13.30

## 2015-03-12 HISTORY — DX: Nocturia: R35.1

## 2015-03-12 LAB — POCT I-STAT, CHEM 8
BUN: 16 mg/dL (ref 6–20)
CALCIUM ION: 1.22 mmol/L (ref 1.13–1.30)
CHLORIDE: 100 mmol/L — AB (ref 101–111)
Creatinine, Ser: 0.7 mg/dL (ref 0.61–1.24)
GLUCOSE: 127 mg/dL — AB (ref 65–99)
HEMATOCRIT: 45 % (ref 39.0–52.0)
Hemoglobin: 15.3 g/dL (ref 13.0–17.0)
POTASSIUM: 3.7 mmol/L (ref 3.5–5.1)
Sodium: 138 mmol/L (ref 135–145)
TCO2: 23 mmol/L (ref 0–100)

## 2015-03-12 SURGERY — CYSTOURETEROSCOPY, WITH RETROGRADE PYELOGRAM AND STENT INSERTION
Anesthesia: General | Site: Ureter | Laterality: Right

## 2015-03-12 MED ORDER — ACETAMINOPHEN 10 MG/ML IV SOLN
INTRAVENOUS | Status: DC | PRN
  Administered 2015-03-12: 1000 mg via INTRAVENOUS

## 2015-03-12 MED ORDER — HYDROCODONE-ACETAMINOPHEN 5-325 MG PO TABS: 1.0000 | ORAL_TABLET | Freq: Four times a day (QID) | ORAL | Status: DC | PRN

## 2015-03-12 MED ORDER — PROPOFOL 10 MG/ML IV BOLUS
INTRAVENOUS | Status: DC | PRN
  Administered 2015-03-12: 200 mg via INTRAVENOUS

## 2015-03-12 MED ORDER — LACTATED RINGERS IV SOLN
INTRAVENOUS | Status: DC
  Administered 2015-03-12 (×3): via INTRAVENOUS
  Filled 2015-03-12: qty 1000

## 2015-03-12 MED ORDER — KETOROLAC TROMETHAMINE 30 MG/ML IJ SOLN
INTRAMUSCULAR | Status: DC | PRN
  Administered 2015-03-12: 30 mg via INTRAVENOUS

## 2015-03-12 MED ORDER — GLYCOPYRROLATE 0.2 MG/ML IJ SOLN
INTRAMUSCULAR | Status: DC | PRN
  Administered 2015-03-12: 0.2 mg via INTRAVENOUS

## 2015-03-12 MED ORDER — MIDAZOLAM HCL 2 MG/2ML IJ SOLN
INTRAMUSCULAR | Status: AC
  Filled 2015-03-12: qty 2

## 2015-03-12 MED ORDER — CEFAZOLIN SODIUM-DEXTROSE 2-3 GM-% IV SOLR
2.0000 g | INTRAVENOUS | Status: AC
  Administered 2015-03-12: 2 g via INTRAVENOUS
  Filled 2015-03-12: qty 50

## 2015-03-12 MED ORDER — HYDROMORPHONE HCL 1 MG/ML IJ SOLN
0.2500 mg | INTRAMUSCULAR | Status: DC | PRN
  Filled 2015-03-12: qty 1

## 2015-03-12 MED ORDER — CEFAZOLIN SODIUM 1-5 GM-% IV SOLN
1.0000 g | INTRAVENOUS | Status: DC
  Filled 2015-03-12: qty 50

## 2015-03-12 MED ORDER — DEXAMETHASONE SODIUM PHOSPHATE 4 MG/ML IJ SOLN
INTRAMUSCULAR | Status: DC | PRN
  Administered 2015-03-12: 10 mg via INTRAVENOUS

## 2015-03-12 MED ORDER — FENTANYL CITRATE (PF) 100 MCG/2ML IJ SOLN
INTRAMUSCULAR | Status: AC
  Filled 2015-03-12: qty 4

## 2015-03-12 MED ORDER — PHENYLEPHRINE HCL 10 MG/ML IJ SOLN
INTRAMUSCULAR | Status: DC | PRN
  Administered 2015-03-12 (×4): 40 ug via INTRAVENOUS

## 2015-03-12 MED ORDER — EPHEDRINE SULFATE 50 MG/ML IJ SOLN
INTRAMUSCULAR | Status: DC | PRN
  Administered 2015-03-12: 20 mg via INTRAVENOUS
  Administered 2015-03-12 (×3): 10 mg via INTRAVENOUS

## 2015-03-12 MED ORDER — FENTANYL CITRATE (PF) 100 MCG/2ML IJ SOLN
INTRAMUSCULAR | Status: DC | PRN
  Administered 2015-03-12 (×2): 12.5 ug via INTRAVENOUS
  Administered 2015-03-12: 25 ug via INTRAVENOUS
  Administered 2015-03-12: 50 ug via INTRAVENOUS
  Administered 2015-03-12: 12.5 ug via INTRAVENOUS
  Administered 2015-03-12 (×2): 25 ug via INTRAVENOUS
  Administered 2015-03-12 (×3): 12.5 ug via INTRAVENOUS

## 2015-03-12 MED ORDER — STERILE WATER FOR IRRIGATION IR SOLN
Status: DC | PRN
Start: 2015-03-12 — End: 2015-03-12
  Administered 2015-03-12: 500 mL

## 2015-03-12 MED ORDER — LIDOCAINE HCL (CARDIAC) 20 MG/ML IV SOLN
INTRAVENOUS | Status: DC | PRN
  Administered 2015-03-12: 80 mg via INTRAVENOUS

## 2015-03-12 MED ORDER — CIPROFLOXACIN HCL 500 MG PO TABS: 500.0000 mg | ORAL_TABLET | Freq: Two times a day (BID) | ORAL | Status: DC

## 2015-03-12 MED ORDER — IOHEXOL 350 MG/ML SOLN
INTRAVENOUS | Status: DC | PRN
  Administered 2015-03-12: 20 mL via URETHRAL

## 2015-03-12 MED ORDER — SODIUM CHLORIDE 0.9 % IR SOLN
Status: DC | PRN
  Administered 2015-03-12: 6000 mL

## 2015-03-12 MED ORDER — CEFAZOLIN SODIUM-DEXTROSE 2-3 GM-% IV SOLR
INTRAVENOUS | Status: AC
  Filled 2015-03-12: qty 50

## 2015-03-12 MED ORDER — ONDANSETRON HCL 4 MG/2ML IJ SOLN
INTRAMUSCULAR | Status: DC | PRN
  Administered 2015-03-12: 4 mg via INTRAVENOUS

## 2015-03-12 SURGICAL SUPPLY — 45 items
ADAPTER CATH URET PLST 4-6FR (CATHETERS) IMPLANT
BAG DRAIN URO-CYSTO SKYTR STRL (DRAIN) ×3 IMPLANT
BASKET LASER NITINOL 1.9FR (BASKET) IMPLANT
BASKET STNLS GEMINI 4WIRE 3FR (BASKET) IMPLANT
BASKET STONE 1.7 NGAGE (UROLOGICAL SUPPLIES) ×3 IMPLANT
BASKET ZERO TIP NITINOL 2.4FR (BASKET) IMPLANT
CANISTER SUCT LVC 12 LTR MEDI- (MISCELLANEOUS) IMPLANT
CATH INTERMIT  6FR 70CM (CATHETERS) ×3 IMPLANT
CATH URET 5FR 28IN CONE TIP (BALLOONS)
CATH URET 5FR 28IN OPEN ENDED (CATHETERS) IMPLANT
CATH URET 5FR 70CM CONE TIP (BALLOONS) IMPLANT
CATH URET DUAL LUMEN 6-10FR 50 (CATHETERS) IMPLANT
CLOTH BEACON ORANGE TIMEOUT ST (SAFETY) ×3 IMPLANT
ELECT REM PT RETURN 9FT ADLT (ELECTROSURGICAL)
ELECTRODE REM PT RTRN 9FT ADLT (ELECTROSURGICAL) IMPLANT
FIBER LASER FLEXIVA 200 (UROLOGICAL SUPPLIES) ×3 IMPLANT
FIBER LASER FLEXIVA 365 (UROLOGICAL SUPPLIES) IMPLANT
FIBER LASER TRAC TIP (UROLOGICAL SUPPLIES) IMPLANT
GLOVE BIO SURGEON STRL SZ 6.5 (GLOVE) ×4 IMPLANT
GLOVE BIO SURGEON STRL SZ7 (GLOVE) ×3 IMPLANT
GLOVE BIO SURGEON STRL SZ7.5 (GLOVE) ×9 IMPLANT
GLOVE BIO SURGEONS STRL SZ 6.5 (GLOVE) ×2
GLOVE INDICATOR 6.5 STRL GRN (GLOVE) ×6 IMPLANT
GLOVE INDICATOR 7.0 STRL GRN (GLOVE) ×3 IMPLANT
GLOVE SURG SS PI 7.5 STRL IVOR (GLOVE) ×3 IMPLANT
GOWN STRL REUS W/ TWL LRG LVL3 (GOWN DISPOSABLE) ×2 IMPLANT
GOWN STRL REUS W/ TWL XL LVL3 (GOWN DISPOSABLE) ×4 IMPLANT
GOWN STRL REUS W/TWL LRG LVL3 (GOWN DISPOSABLE) ×4
GOWN STRL REUS W/TWL XL LVL3 (GOWN DISPOSABLE) ×8
GUIDEWIRE 0.038 PTFE COATED (WIRE) ×3 IMPLANT
GUIDEWIRE ANG ZIPWIRE 038X150 (WIRE) IMPLANT
GUIDEWIRE STR DUAL SENSOR (WIRE) ×6 IMPLANT
IV NS IRRIG 3000ML ARTHROMATIC (IV SOLUTION) ×6 IMPLANT
KIT BALLIN UROMAX 15FX10 (LABEL) IMPLANT
KIT BALLN UROMAX 15FX4 (MISCELLANEOUS) IMPLANT
KIT BALLN UROMAX 26 75X4 (MISCELLANEOUS)
MANIFOLD NEPTUNE II (INSTRUMENTS) ×3 IMPLANT
PACK CYSTO (CUSTOM PROCEDURE TRAY) ×3 IMPLANT
SET HIGH PRES BAL DIL (LABEL)
SHEATH ACCESS URETERAL 38CM (SHEATH) ×3 IMPLANT
STENT POLARIS LOOP 6FR X 26 CM (STENTS) ×3 IMPLANT
SYRINGE IRR TOOMEY STRL 70CC (SYRINGE) IMPLANT
TUBE CONNECTING 12'X1/4 (SUCTIONS) ×1
TUBE CONNECTING 12X1/4 (SUCTIONS) ×2 IMPLANT
WATER STERILE IRR 500ML POUR (IV SOLUTION) ×3 IMPLANT

## 2015-03-12 NOTE — OR Nursing (Signed)
Right ureteral stone taking by Dr. Mena Goes.

## 2015-03-12 NOTE — Interval H&P Note (Signed)
History and Physical Interval Note:  03/12/2015 10:53 AM  Phillip Wong  has presented today for surgery, with the diagnosis of RIGHT HYDRONYPHROSIS/RIGHT UPJ CALCULUS  The various methods of treatment have been discussed with the patient and family. After consideration of risks, benefits and other options for treatment, the patient has consented to  Procedure(s): CYSTO/RIGHT RETROGRADE PYELOGRAM/URETEROSCOPY/STONE EXTRACTION/POSSIBLE STENT PLACEMENT (Right) HOLMIUM LASER APPLICATION (Right) as a surgical intervention .  The patient's history has been reviewed, patient examined, no change in status, stable for surgery.  I have reviewed the patient's chart and labs.  Questions were answered to the patient's satisfaction.  Patient reports he passed some large fragments after I remove the stent but he has not passed any fragments since he saw Diane at the ultrasound. We talked about the concern with obstruction and that he could develop symptoms, renal atrophy/failure, infection. The other option would be to continue surveillance and repeat something like a CT scan in a few weeks. He is certainly very familiar with stent pain and the procedures. After careful consideration he elects to proceed. He did say had some fleeting chest pain after the last procedure but his exam is normal today. We repeated an EKG and that's normal. I talked to Dr. Sampson Goon and we are comfortable moving forward. Patient is comfortable going ahead with the procedure.   [name]

## 2015-03-12 NOTE — Transfer of Care (Signed)
Immediate Anesthesia Transfer of Care Note  Patient: Phillip Wong  Procedure(s) Performed: Procedure(s) (LRB): CYSTO/RIGHT RETROGRADE PYELOGRAM/URETEROSCOPY/STONE EXTRACTION WITH BASKET AND RIGHT STENT PLACEMENT. (Right) HOLMIUM LASER APPLICATION (Right)  Patient Location: PACU  Anesthesia Type: General  Level of Consciousness: awake, sedated, patient cooperative and responds to stimulation  Airway & Oxygen Therapy: Patient Spontanous Breathing and Patient connected to face mask oxygen  Post-op Assessment: Report given to PACU RN, Post -op Vital signs reviewed and stable and Patient moving all extremities  Post vital signs: Reviewed and stable  Complications: No apparent anesthesia complications

## 2015-03-12 NOTE — Anesthesia Preprocedure Evaluation (Addendum)
Anesthesia Evaluation  Patient identified by MRN, date of birth, ID band Patient awake    Reviewed: Allergy & Precautions, H&P , NPO status , Patient's Chart, lab work & pertinent test results, reviewed documented beta blocker date and time   Airway Mallampati: II  TM Distance: >3 FB Neck ROM: Full    Dental no notable dental hx. (+) Edentulous Upper, Edentulous Lower, Dental Advisory Given   Pulmonary neg pulmonary ROS, former smoker,  breath sounds clear to auscultation  Pulmonary exam normal       Cardiovascular hypertension, Pt. on medications and Pt. on home beta blockers Rhythm:Regular Rate:Normal     Neuro/Psych negative neurological ROS  negative psych ROS   GI/Hepatic negative GI ROS, Neg liver ROS,   Endo/Other  Hyperthyroidism   Renal/GU Renal disease  negative genitourinary   Musculoskeletal  (+) Arthritis -, Osteoarthritis,    Abdominal   Peds  Hematology negative hematology ROS (+)   Anesthesia Other Findings   Reproductive/Obstetrics negative OB ROS                           Anesthesia Physical Anesthesia Plan  ASA: II  Anesthesia Plan: General   Post-op Pain Management:    Induction: Intravenous  Airway Management Planned: LMA  Additional Equipment:   Intra-op Plan:   Post-operative Plan: Extubation in OR  Informed Consent: I have reviewed the patients History and Physical, chart, labs and discussed the procedure including the risks, benefits and alternatives for the proposed anesthesia with the patient or authorized representative who has indicated his/her understanding and acceptance.   Dental advisory given  Plan Discussed with: CRNA  Anesthesia Plan Comments:        Anesthesia Quick Evaluation

## 2015-03-12 NOTE — Op Note (Signed)
Preoperative diagnosis: Right ureteral and renal pelvis calculus  Postoperative diagnosis: Right  Ureteral and renal pelvis calculus  Procedure:  1. Cystoscopy 2. Right ureteroscopy and stone removal - complex 3. Ureteroscopic laser lithotripsy 4. Right ureteral stent placement (6Fr x 24 cm) 5. Right retrograde pyelography with interpretation  Surgeon: Mena Goes, MD  Resident: Adela Lank, MD  Anesthesia: General  Complications: None  Intraoperative findings: Right retrograde pyelography demonstrated filling defects in the right proximal ureter consistent with ureteral stones.  Also numerous stone fragments in the right kidney.  Successful laser lithotripsy and basket extraction of stones.  Ureter free of stones at end of case under direct vision.  Only small fragments remained in the right collecting system.  Ureteral stent without dangle attached left draining the right kidney.  EBL: Minimal  Specimens: 1. Right calculus  Disposition of specimens: [facility] for stone analysis  Indication: Phillip Wong is a 69 y.o. male patient with urolithiasis. After reviewing the management options for treatment, they elected to proceed with the above surgical procedure(s). We have discussed the potential benefits and risks of the procedure, side effects of the proposed treatment, the likelihood of the patient achieving the goals of the procedure, and any potential problems that might occur during the procedure or recuperation. Informed consent has been obtained.  Description of procedure:  The patient was taken to the operating room and general anesthesia was induced.  The patient was placed in the dorsal lithotomy position, prepped and draped in the usual sterile fashion, and preoperative antibiotics were administered. A preoperative time-out was performed.   Cystourethroscopy was performed.  The patient's urethra was examined and was normal. The bladder was then  systematically examined in its entirety. There was no evidence for any bladder tumors, stones, or other mucosal pathology.    Attention then turned to the Right ureteral orifice and a ureteral catheter was used to intubate the ureteral orifice.  Omnipaque contrast was injected through the ureteral catheter and a retrograde pyelogram was performed with findings as dictated above.  A 0.38 sensor guidewire was then advanced up the Right ureter into the renal pelvis under fluoroscopic guidance.  A semi-rigid ureteroscope was advanced into the ureter and multiple small stones were seen in the right ureter.  These were then fragmented using a laser fiber.  Mutiple fragments were basket extracted.  We then appreciated further stone more proximal to this location and decided that flexible ureteroscopy would be necessary to treat the remaining stone.  A 12/14 Fr ureteral access sheath was then advanced over the guide wire. The digital flexible ureteroscope was then advanced through the access sheath into the ureter next to the guidewire and the calculus was identified and was located in the area of the UPJ as well as other small stone fragments in the lower and interpolar regions of the kidney.  The stone was then fragmented with the 200 micron holmium laser fiber.  All sizable stones were then removed with a basket.  Reinspection of the ureter/renal pelvis revealed no remaining large visible stones or fragments of significant size.   The safety wire was then replaced and the access sheath removed.  The guidewire was backloaded through the cystoscope and a ureteral stent was advance over the wire using Seldinger technique.  The stent was positioned appropriately under fluoroscopic and cystoscopic guidance.  The wire was then removed with an adequate stent curl noted in the renal pelvis as well as in the bladder.  The bladder  was then emptied and the procedure ended.  The patient appeared to tolerate the procedure  well and without complications.  The patient was able to be awakened and transferred to the recovery unit in satisfactory condition.

## 2015-03-12 NOTE — Discharge Instructions (Addendum)
1. You may see some blood in the urine and may have some burning with urination for 48-72 hours. You also may notice that you have to urinate more frequently or urgently after your procedure which is normal.  °2. You should call should you develop an inability urinate, fever > 101, persistent nausea and vomiting that prevents you from eating or drinking to stay hydrated.  °3. You have a stent. you will likely urinate more frequently and urgently until the stent is removed and you may experience some discomfort/pain in the lower abdomen and flank especially when urinating. You may take pain medication prescribed to you if needed for pain. You may also intermittently have blood in the urine until the stent is removed. It is essential that you follow up for stent removal as instructed. If the stent is left in place, this could result in permanent damage to and even loss of the kidney, as well as other complications like infections and kidney stones.  ° °Post Anesthesia Home Care Instructions ° °Activity: °Get plenty of rest for the remainder of the day. A responsible adult should stay with you for 24 hours following the procedure.  °For the next 24 hours, DO NOT: °-Drive a car °-Operate machinery °-Drink alcoholic beverages °-Take any medication unless instructed by your physician °-Make any legal decisions or sign important papers. ° °Meals: °Start with liquid foods such as gelatin or soup. Progress to regular foods as tolerated. Avoid greasy, spicy, heavy foods. If nausea and/or vomiting occur, drink only clear liquids until the nausea and/or vomiting subsides. Call your physician if vomiting continues. ° °Special Instructions/Symptoms: °Your throat may feel dry or sore from the anesthesia or the breathing tube placed in your throat during surgery. If this causes discomfort, gargle with warm salt water. The discomfort should disappear within 24 hours. ° °If you had a scopolamine patch placed behind your ear for the  management of post- operative nausea and/or vomiting: ° °1. The medication in the patch is effective for 72 hours, after which it should be removed.  Wrap patch in a tissue and discard in the trash. Wash hands thoroughly with soap and water. °2. You may remove the patch earlier than 72 hours if you experience unpleasant side effects which may include dry mouth, dizziness or visual disturbances. °3. Avoid touching the patch. Wash your hands with soap and water after contact with the patch. °  ° °

## 2015-03-13 NOTE — Anesthesia Postprocedure Evaluation (Signed)
  Anesthesia Post-op Note  Patient: ABDUR NITCHER  Procedure(s) Performed: Procedure(s): CYSTO/RIGHT RETROGRADE PYELOGRAM/URETEROSCOPY/STONE EXTRACTION WITH BASKET AND RIGHT STENT PLACEMENT. (Right) HOLMIUM LASER APPLICATION (Right)  Patient Location: PACU  Anesthesia Type:General  Level of Consciousness: awake and alert   Airway and Oxygen Therapy: Patient Spontanous Breathing  Post-op Pain: none  Post-op Assessment: Post-op Vital signs reviewed, Patient's Cardiovascular Status Stable and Respiratory Function Stable  Post-op Vital Signs: Reviewed  Filed Vitals:   03/12/15 1530  BP: 129/66  Pulse: 62  Temp: 36.4 C  Resp: 16    Complications: No apparent anesthesia complications

## 2015-03-14 ENCOUNTER — Encounter (HOSPITAL_BASED_OUTPATIENT_CLINIC_OR_DEPARTMENT_OTHER): Payer: Self-pay | Admitting: Urology

## 2016-12-14 DIAGNOSIS — I739 Peripheral vascular disease, unspecified: Secondary | ICD-10-CM | POA: Diagnosis present

## 2016-12-14 NOTE — H&P (Signed)
OFFICE VISIT NOTES COPIED TO EPIC FOR DOCUMENTATION  . History of Present Illness Phillip Wong(Phillip R. Shynice Sigel MD; 11/30/2016 6:35 PM) Patient words: Last O/V 11/05/2016:F/U for abi & abdominal duplex.  The patient is a 71 year old male who presents for a Follow-up for Chest pain. Patient with recent onset of chest pain suggestive of angina pectoris underwent nuclear stress testing on 10/30/2016 which had revealed a medium-sized inferolateral and lateral wall ischemia with preserved LVEF, felt to be intermediate to study. However with medical therapy is symptoms of chest pain had and also he had class 1-2 angina pectoris.  He was also complaining of severe pain in his right hip and right I with exertional activity. I did order abdominal aortic duplex and ABIs and he now presents here for follow-up.  Patient has not had any further chest discomfort with doing exertion activity except if he does more than usual moderate exertion. However mostly he has noticed decreased physical activity due to severe pain in his right hip as soon as he starts to walk about 100 feet and he has to sit and rest. He is more concerned about his leg pain in his chest pain.  He is quit smoking cigarettes however has been using up. He continues to use dip. He called our office 2 weeks ago stating that his blood pressure has been very low, systolic pressure around 70-80 mmHg. He had discontinued amlodipine since last office visit and states BP is now stable and around 120 mm Hg systolic.  Additional reasons for visit:  Follow-up for Peripheral vascular disease    Problem List/Past Medical Chase Caller(Phillip Wong; 11/30/2016 2:45 PM) Essential hypertension, benign (I10)  Other dyspnea and respiratory abnormality (R06.09, R09.89)  Echo 02/04/12: Normal LVEF, Small cavity LV. Mild LVH, mild diastolic dysfunction. Lexiscan sestamibi 02/05/12: Normal perfusion without ischemia. Normal LVEF. Abnormal EKG; ECG 01/28/12 SR @ 77/min, TWI anterior V  leads, normal intervals. Lower Venous doppler 01/28/12: no DVT left leg. Mixed hyperlipidemia (E78.2)  Nonspecific abnormal electrocardiogram (ECG) (EKG) (R94.31)  Exertional chest pain (R07.9)  Lexiscan myoview stress test 10/30/2016: 1. The resting electrocardiogram demonstrated normal sinus rhythm, normal resting conduction, no resting arrhythmias and normal rest repolarization. Stress EKG is non-diagnostic for ischemia as it a pharmacologic stress using Lexiscan. Stress symptoms included dyspnea. 2. The perfusion imaging study demonstrates a medium-sized very mild ischemia in the lateral wall and inferolateral wall extending from the base towards the apex. Left ventricular systolic function calculated by QGS was 79%. This is an intermediate risk study, clinical correlation recommended. Right leg claudication (I73.9)  ABI 11/18/2016: This exam reveals mildly decreased perfusion of the right lower extremity, R ABI 0.86, noted at the dorsalis pedis and post tibial artery level with biphasic waveforms. This exam reveals normal perfusion of the left lower extremity, L ABI 1.03. Biphasic waveforms. Non compressible posterior tibial vessel. Screening for AAA (abdominal aortic aneurysm) (Z13.6)  Abdominal aortic duplex 11/18/2016: Diffuse plaque noted in the proximal, mid and distal aorta. No AAA observed.Peak velocity mildly elevated in the bilateral CIA suggesting <50% stenosis. Clinical correlation recommended.  Allergies Chase Caller(Phillip Wong; 11/30/2016 2:45 PM) No Known Drug Allergies 08/18/2012  Family History Chase Caller(Phillip Wong; 11/30/2016 2:45 PM) Mother died at age 1-unsure of cause of death. Father died in his 4240's from natural causes. 4 siblings (2 deceased-doesn't know medical history).   Social History Chase Caller(Phillip Wong; 11/30/2016 2:54 PM) No smoking-quit in 1999. Drinks a 6 pack of beer daily. Married. 2 children.  no alcohol use, uses  dip  Past Surgical History Chase Caller; 11/30/2016 2:45  PM) None 11/05/2016  Medication History Phillip Pert, MD; 11/30/2016 3:27 PM) Phillip Wong (7.5-325MG  Tablet, 1 Oral four times daily, as needed) Active. Aspirin (81MG  Tablet, 1 Oral daily, before a meal) Active. Nitrostat (0.4MG  Tab Sublingual, 1 (one) Tablet Sublingual every 5 minutes as needed for chest pain., Taken starting 11/06/2016) Active. Propylthiouracil (50MG  Tablet, 1 Oral two times daily) Active. Carvedilol (12.5MG  Tablet, 1 Oral two times daily) Active. Hydrochlorothiazide (25MG  Tablet, 1 Oral daily) Active. Allopurinol (300MG  Tablet, 1 Oral daily) Active. Lisinopril (10MG  Tablet, 1 Oral daily) Active. Finasteride (5MG  Tablet, 1 Oral daily) Active. Pravastatin Sodium (40MG  Tablet, 1 Oral daily, in the evening) Active. Amitriptyline HCl (10MG  Tablet, 1 Oral at bedtime) Active. Tamsulosin HCl (0.4MG  Capsule, 1 Oral at bedtime) Active. Vitamin D 1.25mg  (once a week) Active. Azithromycin (250MG  Tablet, 1 Oral monday & thursday) Active. AmLODIPine Besylate (5MG  Tablet, 1 Oral daily, Taken starting 11/11/2016) Active: Hypotension. (BP at home sometimes < 80 mm Hg) Medications Reconciled (verbally with patient list present)  Diagnostic Studies History Phillip Pert, MD; 11/30/2016 3:19 PM) Echo 02/04/12: Normal LVEF, Small cavity LV. Mild LVH, mild diastolic dysfunction.  Lexiscan sestamibi 02/05/12: Normal perfusion without ischemia. Normal LVEF.  Abnormal EKG; ECG 01/28/12 SR @ 77/min, TWI anterior V leads, normal intervals.  Lower Venous doppler 01/28/12: no DVT left leg.  Labwork  Labs 04/06/16: Iron study normal. CBC normal. Potassium 3.6, creatinine 0.81, sodium 132, CMP normal. Cholesterol 133, triglycerides 238, HDL 41, LDL 44. TSH 0.96, free T4 0.7, vitamin B12 432. Hemoglobin A1c 6.2. Vitamin D 28. Labs 05/31/12 CBC, CMP, TSH normal. HbA1c 5.7. T. Chol 185, TG 206, HDL 51, LDL 93. NHDL 134. Nuclear stress test 10/30/2016 1. The resting electrocardiogram  demonstrated normal sinus rhythm, normal resting conduction, no resting arrhythmias and normal rest repolarization. Stress EKG is non-diagnostic for ischemia as it a pharmacologic stress using Lexiscan. Stress symptoms included dyspnea. 2. The perfusion imaging study demonstrates a medium-sized very mild ischemia in the lateral wall and inferolateral wall extending from the base towards the apex. Left ventricular systolic function calculated by QGS was 79%. This is an intermediate risk study, clinical correlation recommended. Abdominal Ultrasound 11/18/2016 Diffuse plaque noted in the proximal, mid and distal aorta. No AAA observed.Peak velocity mildly elevated in the bilateral CIA suggesting <50% stenosis. Clinical correlation recommended. ABI's 11/18/2016 This exam reveals mildly decreased perfusion of the right lower extremity, R ABI 0.86, noted at the dorsalis pedis and post tibial artery level with biphasic waveforms. This exam reveals normal perfusion of the left lower extremity, L ABI 1.03. Biphasic waveforms. Non compressible posterior tibial vessel.  Other Problems Chase Caller; 11/30/2016 2:45 PM) Hospitalized for pneumonia in 1985.     Review of Systems Phillip Pert MD; 11/30/2016 3:38 PM) General Present- Feeling well. Not Present- Tiredness and Unable to Sleep Lying Flat. HEENT Not Present- Blurred Vision. Respiratory Present- Difficulty Breathing on Exertion. Not Present- Bloody sputum and Wakes up from Sleep Wheezing or Short of Breath. Cardiovascular Present- Chest Pain and Claudications (right thigh and right hip). Not Present- Edema, Leg Cramps, Palpitations and Paroxysmal Nocturnal Dyspnea. Gastrointestinal Not Present- Black, Tarry Stool, Bloody Stool and Heartburn. Musculoskeletal Present- Joint Stiffness. Not Present- Claudication and Joint Pain. Neurological Not Present- Focal Neurological Symptoms. Psychiatric Not Present- Personality Changes and Suicidal  Ideation. Hematology Not Present- Blood Clots, Easy Bruising and Nose Bleed.  Vitals Chase Caller; 11/30/2016 3:04 PM) 11/30/2016 2:49 PM Weight:  207.56 lb Height: 70in Body Surface Area: 2.12 m Body Mass Index: 29.78 kg/m  Pulse: 85 (Regular)  P.OX: 98% (Room air) BP: 142/82 (Sitting, Left Arm, Standard)       Physical Exam Phillip Pert, MD; 11/30/2016 6:35 PM) General Mental Status-Alert. General Appearance-Cooperative and Appears stated age. Build & Nutrition-Moderately built and Mildly obese.  Head and Neck Thyroid Gland Characteristics - normal size and consistency and no palpable nodules.  Chest and Lung Exam Chest and lung exam reveals -quiet, even and easy respiratory effort with no use of accessory muscles, non-tender and on auscultation, normal breath sounds, no adventitious sounds.  Cardiovascular Auscultation Rhythm - Regular. Heart Sounds - Normal heart sounds. Murmurs & Other Heart Sounds: Murmur - Location - Aortic Area. Timing - Early systolic. Grade - II/VI.  Abdomen Palpation/Percussion Normal exam - Non Tender and No hepatosplenomegaly.  Peripheral Vascular Lower Extremity Inspection - Bilateral - Inspection Normal. Palpation - Edema - Bilateral - No edema. Femoral pulse - Left - 2+. Right - Feeble. Bilateral - Bruit. Popliteal pulse - Left - Absent. Right - 1+. Dorsalis pedis pulse - Bilateral - Normal. Posterior tibial pulse - Left - Feeble. Right - Absent. Carotid arteries - Bilateral-No Carotid bruit.  Neurologic Neurologic evaluation reveals -alert and oriented x 3 with no impairment of recent or remote memory. Motor-Grossly intact without any focal deficits.  Musculoskeletal Global Assessment Left Lower Extremity - no deformities, masses or tenderness, no known fractures. Right Lower Extremity - no deformities, masses or tenderness, no known fractures.    Assessment & Plan (Devonna Shumate; 11/30/2016 3:51  PM) Exertional chest pain (R07.9) Story: Lexiscan myoview stress test 10/30/2016: 1. The resting electrocardiogram demonstrated normal sinus rhythm, normal resting conduction, no resting arrhythmias and normal rest repolarization. Stress EKG is non-diagnostic for ischemia as it a pharmacologic stress using Lexiscan. Stress symptoms included dyspnea. 2. The perfusion imaging study demonstrates a medium-sized very mild ischemia in the lateral wall and inferolateral wall extending from the base towards the apex. Left ventricular systolic function calculated by QGS was 79%. This is an intermediate risk study, clinical correlation recommended. Impression: EKG 08/24/2012 SR with 1st degree AV block. Non specific T inversion anterior leads, cannot R/O ischemia. Normal QT. No change from ECG 01/28/12 Future Plans 12/07/2016: METABOLIC PANEL, BASIC [phone]) - one time 12/07/2016: CBC & PLATELETS (AUTO) (60454) - one time 12/07/2016: PT (PROTHROMBIN TIME) (09811) - one time Other dyspnea and respiratory abnormality (R06.09) Story: Echo 02/04/12: Normal LVEF, Small cavity LV. Mild LVH, mild diastolic dysfunction.  Abnormal EKG; ECG 01/28/12 SR @ 77/min, TWI anterior V leads, normal intervals. Lower Venous doppler 01/28/12: no DVT left leg. Right leg claudication (I73.9) Story: ABI 11/18/2016: This exam reveals mildly decreased perfusion of the right lower extremity, R ABI 0.86, noted at the dorsalis pedis and post tibial artery level with biphasic waveforms. This exam reveals normal perfusion of the left lower extremity, L ABI 1.03. Biphasic waveforms. Non compressible posterior tibial vessel. Labwork Story:  12/10/2016: Creatinine 0.84, potassium 4.8, calcium slightly elevated at 10.7, BMP otherwise normal.  CBC normal.  INR 1.0, prothrombin time 10.4 Labs 04/06/16: Iron study normal. CBC normal. Potassium 3.6, creatinine 0.81, sodium 132, CMP normal. Cholesterol 133, triglycerides 238, HDL 41, LDL 44. TSH 0.96, free  T4 0.7, vitamin B12 432. Hemoglobin A1c 6.2. Vitamin D 28.  Labs 05/31/12 CBC, CMP, TSH normal. HbA1c 5.7. T. Chol 185, TG 206, HDL 51, LDL 93. NHDL 134. Screening for AAA (abdominal aortic aneurysm) (  Z13.6) Story: Abdominal aortic duplex 11/18/2016: Diffuse plaque noted in the proximal, mid and distal aorta. No AAA observed.Peak velocity mildly elevated in the bilateral CIA suggesting <50% stenosis. Clinical correlation recommended. Future Plans 11/18/2016: Duplex scan of aorta, inferior vena cava, iliac vasculature, or bypass grafts; complete study (16109) - one time Smokeless tobacco use within past 30 days (Z72.0) Mixed hyperlipidemia (E78.2) Current Plans Patient's symptoms chest pain are clearly suggestive of angina pectoris however there appeared to be class 1-2 at most. He is already on appropriate medical therapy, however today's blood pressure was slightly elevated, patient states that his blood pressure has been as low as 70-80 mmHg at home. Hence I did not restart amlodipine which was discontinued after patient had called [place] stating that he gets markedly dizzy when he takes the amlodipine with blood pressure dropping down to 70 mmHg. Has abnormal stress test, we'll continue medical therapy for now. He will bring me blood pressure recordings from home on his next office visit.  With regard to his claudication, he has suggestion ofless than 50% stenosis of the iliac vessel, however he symptoms are fairly classic in his right hip claudication. His options include CT angiogram versus peripheral arteriogram, after discussions, arteriogram would be a direct approach to evaluation of his PAD. We will proceed with the same. Lipids are well-controlled, I have evaluated his labs. I have discussed with him extensively regarding complete cessation of any tobacco product use.  Needs to schedule for peripheral arteriogram and possible angioplasty given symptoms. Patient understands the risks, benefits,  alternatives including medical therapy, CT angiography. Patient understands <1-2% risk of death, embolic complications, bleeding, infection, renal failure, urgent surgical revascularization, but not limited to these and wants to proceed.  CC Dr. Aida Puffer.    Signed by Phillip Pert, MD (11/30/2016 6:35 PM)

## 2016-12-15 ENCOUNTER — Ambulatory Visit (HOSPITAL_COMMUNITY)
Admission: RE | Admit: 2016-12-15 | Discharge: 2016-12-15 | Disposition: A | Discharge status: Home / Self Care | Payer: Medicare Other | Source: Ambulatory Visit | Attending: Cardiology | Admitting: Cardiology

## 2016-12-15 ENCOUNTER — Encounter (HOSPITAL_COMMUNITY)
Admission: RE | Disposition: A | Discharge status: Home / Self Care | Payer: Self-pay | Source: Ambulatory Visit | Attending: Cardiology

## 2016-12-15 ENCOUNTER — Encounter (HOSPITAL_COMMUNITY): Payer: Self-pay | Admitting: Cardiology

## 2016-12-15 DIAGNOSIS — R0789 Other chest pain: Secondary | ICD-10-CM | POA: Insufficient documentation

## 2016-12-15 DIAGNOSIS — R9431 Abnormal electrocardiogram [ECG] [EKG]: Secondary | ICD-10-CM | POA: Diagnosis not present

## 2016-12-15 DIAGNOSIS — I739 Peripheral vascular disease, unspecified: Secondary | ICD-10-CM | POA: Diagnosis present

## 2016-12-15 DIAGNOSIS — F17220 Nicotine dependence, chewing tobacco, uncomplicated: Secondary | ICD-10-CM | POA: Insufficient documentation

## 2016-12-15 DIAGNOSIS — R9439 Abnormal result of other cardiovascular function study: Secondary | ICD-10-CM | POA: Insufficient documentation

## 2016-12-15 DIAGNOSIS — M25551 Pain in right hip: Secondary | ICD-10-CM | POA: Insufficient documentation

## 2016-12-15 DIAGNOSIS — E782 Mixed hyperlipidemia: Secondary | ICD-10-CM | POA: Insufficient documentation

## 2016-12-15 DIAGNOSIS — I70211 Atherosclerosis of native arteries of extremities with intermittent claudication, right leg: Secondary | ICD-10-CM | POA: Diagnosis not present

## 2016-12-15 DIAGNOSIS — I10 Essential (primary) hypertension: Secondary | ICD-10-CM | POA: Insufficient documentation

## 2016-12-15 DIAGNOSIS — I440 Atrioventricular block, first degree: Secondary | ICD-10-CM | POA: Diagnosis not present

## 2016-12-15 DIAGNOSIS — Z7982 Long term (current) use of aspirin: Secondary | ICD-10-CM | POA: Insufficient documentation

## 2016-12-15 HISTORY — PX: PERIPHERAL VASCULAR INTERVENTION: CATH118257

## 2016-12-15 HISTORY — PX: ABDOMINAL AORTOGRAM W/LOWER EXTREMITY: CATH118223

## 2016-12-15 LAB — POCT ACTIVATED CLOTTING TIME: ACTIVATED CLOTTING TIME: 142 s

## 2016-12-15 SURGERY — ABDOMINAL AORTOGRAM W/LOWER EXTREMITY
Anesthesia: LOCAL

## 2016-12-15 MED ORDER — FENTANYL CITRATE (PF) 100 MCG/2ML IJ SOLN
INTRAMUSCULAR | Status: DC | PRN
  Administered 2016-12-15: 50 ug via INTRAVENOUS
  Administered 2016-12-15: 25 ug via INTRAVENOUS

## 2016-12-15 MED ORDER — HEPARIN SODIUM (PORCINE) 1000 UNIT/ML IJ SOLN
INTRAMUSCULAR | Status: DC | PRN
  Administered 2016-12-15: 3000 [IU] via INTRAVENOUS

## 2016-12-15 MED ORDER — CLOPIDOGREL BISULFATE 300 MG PO TABS
ORAL_TABLET | ORAL | Status: AC
  Filled 2016-12-15: qty 2

## 2016-12-15 MED ORDER — CEFAZOLIN SODIUM-DEXTROSE 2-4 GM/100ML-% IV SOLN
2.0000 g | Freq: Once | INTRAVENOUS | Status: AC
  Administered 2016-12-15: 2 g via INTRAVENOUS

## 2016-12-15 MED ORDER — ACETAMINOPHEN 325 MG PO TABS: 325.0000 mg | ORAL_TABLET | ORAL | Status: DC | PRN

## 2016-12-15 MED ORDER — ACETAMINOPHEN 325 MG RE SUPP: 325.0000 mg | RECTAL | Status: DC | PRN

## 2016-12-15 MED ORDER — MIDAZOLAM HCL 2 MG/2ML IJ SOLN
INTRAMUSCULAR | Status: AC
  Filled 2016-12-15: qty 2

## 2016-12-15 MED ORDER — DOCUSATE SODIUM 100 MG PO CAPS: 100.0000 mg | ORAL_CAPSULE | Freq: Every day | ORAL | Status: DC

## 2016-12-15 MED ORDER — HEPARIN (PORCINE) IN NACL 2-0.9 UNIT/ML-% IJ SOLN
INTRAMUSCULAR | Status: AC
  Filled 2016-12-15: qty 1000

## 2016-12-15 MED ORDER — LIDOCAINE HCL (PF) 1 % IJ SOLN
INTRAMUSCULAR | Status: DC | PRN
  Administered 2016-12-15: 15 mL

## 2016-12-15 MED ORDER — SODIUM CHLORIDE 0.9 % IV SOLN: 500.0000 mL | Freq: Once | INTRAVENOUS | Status: DC | PRN

## 2016-12-15 MED ORDER — IODIXANOL 320 MG/ML IV SOLN
INTRAVENOUS | Status: DC | PRN
  Administered 2016-12-15: 160 mL via INTRA_ARTERIAL

## 2016-12-15 MED ORDER — ALUM & MAG HYDROXIDE-SIMETH 200-200-20 MG/5ML PO SUSP: 15.0000 mL | ORAL | Status: DC | PRN

## 2016-12-15 MED ORDER — SODIUM CHLORIDE 0.9% FLUSH: 3.0000 mL | INTRAVENOUS | Status: DC | PRN

## 2016-12-15 MED ORDER — MIDAZOLAM HCL 2 MG/2ML IJ SOLN
INTRAMUSCULAR | Status: DC | PRN
  Administered 2016-12-15: 2 mg via INTRAVENOUS

## 2016-12-15 MED ORDER — LIDOCAINE HCL (PF) 1 % IJ SOLN
INTRAMUSCULAR | Status: AC
  Filled 2016-12-15: qty 30

## 2016-12-15 MED ORDER — HEPARIN SODIUM (PORCINE) 1000 UNIT/ML IJ SOLN
INTRAMUSCULAR | Status: AC
  Filled 2016-12-15: qty 1

## 2016-12-15 MED ORDER — HEPARIN (PORCINE) IN NACL 2-0.9 UNIT/ML-% IJ SOLN
INTRAMUSCULAR | Status: DC | PRN
  Administered 2016-12-15: 1000 mL

## 2016-12-15 MED ORDER — PANTOPRAZOLE SODIUM 40 MG PO TBEC: 40.0000 mg | DELAYED_RELEASE_TABLET | Freq: Every day | ORAL | Status: DC

## 2016-12-15 MED ORDER — SODIUM CHLORIDE 0.9 % IV SOLN: 250.0000 mL | INTRAVENOUS | Status: DC | PRN

## 2016-12-15 MED ORDER — SODIUM CHLORIDE 0.9 % IV SOLN: INTRAVENOUS | Status: DC

## 2016-12-15 MED ORDER — SODIUM CHLORIDE 0.9 % IV BOLUS (SEPSIS)
500.0000 mL | Freq: Once | INTRAVENOUS | Status: AC
  Administered 2016-12-15: 500 mL via INTRAVENOUS

## 2016-12-15 MED ORDER — CLOPIDOGREL BISULFATE 75 MG PO TABS: 75.0000 mg | ORAL_TABLET | Freq: Every day | ORAL | 0 refills | Status: DC

## 2016-12-15 MED ORDER — SODIUM CHLORIDE 0.9% FLUSH: 3.0000 mL | Freq: Two times a day (BID) | INTRAVENOUS | Status: DC

## 2016-12-15 MED ORDER — ASPIRIN EC 81 MG PO TBEC: 81.0000 mg | DELAYED_RELEASE_TABLET | Freq: Every day | ORAL | Status: AC

## 2016-12-15 MED ORDER — ONDANSETRON HCL 4 MG/2ML IJ SOLN: 4.0000 mg | Freq: Four times a day (QID) | INTRAMUSCULAR | Status: DC | PRN

## 2016-12-15 MED ORDER — PHENOL 1.4 % MT LIQD: 1.0000 | OROMUCOSAL | Status: DC | PRN

## 2016-12-15 MED ORDER — SODIUM CHLORIDE 0.9 % IV SOLN: 1.0000 mL/kg/h | INTRAVENOUS | Status: DC

## 2016-12-15 MED ORDER — CLOPIDOGREL BISULFATE 300 MG PO TABS
ORAL_TABLET | ORAL | Status: DC | PRN
  Administered 2016-12-15: 600 mg via ORAL

## 2016-12-15 MED ORDER — FENTANYL CITRATE (PF) 100 MCG/2ML IJ SOLN
INTRAMUSCULAR | Status: AC
  Filled 2016-12-15: qty 2

## 2016-12-15 MED ORDER — CEFAZOLIN SODIUM-DEXTROSE 2-4 GM/100ML-% IV SOLN
INTRAVENOUS | Status: AC
  Filled 2016-12-15: qty 100

## 2016-12-15 MED ORDER — HYDRALAZINE HCL 20 MG/ML IJ SOLN: 5.0000 mg | INTRAMUSCULAR | Status: DC | PRN

## 2016-12-15 MED ORDER — GUAIFENESIN-DM 100-10 MG/5ML PO SYRP: 15.0000 mL | ORAL_SOLUTION | ORAL | Status: DC | PRN

## 2016-12-15 MED ORDER — LABETALOL HCL 5 MG/ML IV SOLN: 10.0000 mg | INTRAVENOUS | Status: DC | PRN

## 2016-12-15 SURGICAL SUPPLY — 16 items
CATH OMNI FLUSH 5F 65CM (CATHETERS) ×3 IMPLANT
CATH STRAIGHT 5FR 65CM (CATHETERS) ×3 IMPLANT
DEVICE CLOSURE PERCLS PRGLD 6F (VASCULAR PRODUCTS) ×2 IMPLANT
KIT ENCORE 26 ADVANTAGE (KITS) ×3 IMPLANT
KIT PV (KITS) ×3 IMPLANT
PERCLOSE PROGLIDE 6F (VASCULAR PRODUCTS) ×3
SHEATH BRITE TIP 7FR 35CM (SHEATH) ×3 IMPLANT
SHEATH PINNACLE 5F 10CM (SHEATH) ×3 IMPLANT
STENT OMNILINK ELITE 10X29X80 (Permanent Stent) ×3 IMPLANT
STOPCOCK MORSE 400PSI 3WAY (MISCELLANEOUS) ×3 IMPLANT
SYRINGE MEDRAD AVANTA MACH 7 (SYRINGE) ×3 IMPLANT
TRANSDUCER W/STOPCOCK (MISCELLANEOUS) ×3 IMPLANT
TRAY PV CATH (CUSTOM PROCEDURE TRAY) ×3 IMPLANT
TUBING CIL FLEX 10 FLL-RA (TUBING) ×3 IMPLANT
WIRE HITORQ VERSACORE ST 145CM (WIRE) ×3 IMPLANT
WIRE MINI STICK MAX (SHEATH) ×3 IMPLANT

## 2016-12-15 NOTE — Interval H&P Note (Signed)
History and Physical Interval Note:  12/15/2016 8:10 AM  Phillip Wong  has presented today for surgery, with the diagnosis of claudication  The various methods of treatment have been discussed with the patient and family. After consideration of risks, benefits and other options for treatment, the patient has consented to  Procedure(s): Lower Extremity Angiography (N/A) and possible PTA as a surgical intervention .  The patient's history has been reviewed, patient examined, no change in status, stable for surgery.  I have reviewed the patient's chart and labs.  Questions were answered to the patient's satisfaction.     Yates DecampGANJI, Pratt Bress

## 2016-12-15 NOTE — Discharge Instructions (Signed)
Femoral Site Care Refer to this sheet in the next few weeks. These instructions provide you with information about caring for yourself after your procedure. Your health care provider may also give you more specific instructions. Your treatment has been planned according to current medical practices, but problems sometimes occur. Call your health care provider if you have any problems or questions after your procedure. What can I expect after the procedure? After your procedure, it is typical to have the following:  Bruising at the site that usually fades within 1-2 weeks.  Blood collecting in the tissue (hematoma) that may be painful to the touch. It should usually decrease in size and tenderness within 1-2 weeks. Follow these instructions at home:  Take medicines only as directed by your health care provider.  Drink plenty of fluids to keep hydrated.  You may shower 24-48 hours after the procedure or as directed by your health care provider. Remove the bandage (dressing) and gently wash the site with plain soap and water. Pat the area dry with a clean towel. Do not rub the site, because this may cause bleeding.  Do not take baths, swim, or use a hot tub until your health care provider approves.  Check your insertion site every day for redness, swelling, or drainage.  Do not apply powder or lotion to the site.  Limit use of stairs to twice a day for the first 2-3 days or as directed by your health care provider.  Do not squat for the first 2-3 days or as directed by your health care provider.  Do not lift over 10 lb (4.5 kg) for 5 days after your procedure or as directed by your health care provider.  Ask your health care provider when it is okay to:  Return to work or school.  Resume usual physical activities or sports.  Resume sexual activity.  Do not drive home if you are discharged the same day as the procedure. Have someone else drive you.  You may drive 24 hours after the  procedure unless otherwise instructed by your health care provider.  Do not operate machinery or power tools for 24 hours after the procedure or as directed by your health care provider.  If your procedure was done as an outpatient procedure, which means that you went home the same day as your procedure, a responsible adult should be with you for the first 24 hours after you arrive home.  Keep all follow-up visits as directed by your health care provider. This is important. Contact a health care provider if:  You have a fever.  You have chills.  You have increased bleeding from the site. Hold pressure on the site. Get help right away if:  You have unusual pain at the site.  You have redness, warmth, or swelling at the site.  You have drainage (other than a small amount of blood on the dressing) from the site.  The site is bleeding, and the bleeding does not stop after 30 minutes of holding steady pressure on the site.  Your leg or foot becomes pale, cool, tingly, or numb. This information is not intended to replace advice given to you by your health care provider. Make sure you discuss any questions you have with your health care provider. Document Released: 05/18/2014 Document Revised: 02/20/2016 Document Reviewed: 04/03/2014 Elsevier Interactive Patient Education  2017 [employer]

## 2017-03-17 ENCOUNTER — Ambulatory Visit (INDEPENDENT_AMBULATORY_CARE_PROVIDER_SITE_OTHER): Payer: Self-pay | Admitting: Orthopaedic Surgery

## 2017-03-25 ENCOUNTER — Encounter (HOSPITAL_COMMUNITY): Payer: Self-pay | Admitting: *Deleted

## 2017-03-30 ENCOUNTER — Ambulatory Visit (INDEPENDENT_AMBULATORY_CARE_PROVIDER_SITE_OTHER): Payer: Medicare Other

## 2017-03-30 ENCOUNTER — Ambulatory Visit (INDEPENDENT_AMBULATORY_CARE_PROVIDER_SITE_OTHER): Payer: Medicare Other | Admitting: Orthopaedic Surgery

## 2017-03-30 VITALS — BP 131/72 | Ht 70.0 in | Wt 204.0 lb

## 2017-03-30 DIAGNOSIS — G8929 Other chronic pain: Secondary | ICD-10-CM

## 2017-03-30 DIAGNOSIS — M542 Cervicalgia: Secondary | ICD-10-CM

## 2017-03-30 DIAGNOSIS — M545 Low back pain, unspecified: Secondary | ICD-10-CM

## 2017-03-30 NOTE — Progress Notes (Addendum)
Office Visit Note   Patient: Phillip Wong           Date of Birth: Jan 08, 1946           MRN: [id number] Visit Date: 03/30/2017              Requested by: Aida Puffer, MD [address] [place] [address] PCP: Aida Puffer, MD   Assessment & Plan: Visit Diagnoses:  1. Chronic midline low back pain without sciatica   2. Neck pain     Plan: Offered him x-rays of the cervical spine and evaluation. We also discussed possible lumbar MRI scan for symptoms. Other than when he bends over repetitively normally does not have symptoms in his back when he is active. Patient states she's not interested in the procedures or surgery I discussed with them that if his symptoms increase or free like to return for reevaluation of her cervical spine he could do that at any time. He has increase in his symptoms he can return we discussed radicular symptoms or claudication symptoms and if they develop he can return for reevaluation.  Follow-Up Instructions: Return if symptoms worsen or fail to improve.   Orders:  Orders Placed This Encounter  Procedures  . XR Lumbar Spine 2-3 Views   No orders of the defined types were placed in this encounter.     Procedures: No procedures performed   Clinical Data: No additional findings.   Subjective: Chief Complaint  Patient presents with  . Lower Back - Pain    HPI 71 year old male turning  38 in  2 days is here with 2 problems. One is pain in his neck that radiates into his shoulder blades and if he does not lay down he gets a headache. He does better please not in a straight back chair. He denies problems with his activities active and doing different activities. His other problem is his lower back which hurts whenever he bends over multiple times such as when he tried to cover his tomato plants. Patient states he hates store-bought tomatoes and only likes to eat home grown tomatoes. When he had to cover his tomato plants he has to crawl on his  knees. He denies associated bowel or bladder symptoms. He's had past history with kidney stones and previous CT scans were reviewed from 2015 and 14 which showed some minimal disc bulge mid lumbar region. He's not had an MRI scan of his lumbar region. No history of back surgery. He denies fever chills. No numbness or tingling in his feet.  Review of Systems review of systems positive for a supraumbilical hernia, peripheral arterial disease previous stent. He was a long-term smoker switched to smokeless tobacco typically uses corn-based wintergreen product without nicotine at times. Positive for coronary artery disease. He's been on Norco for back pain 7.54 times a day for about 5 years total. Neck and back pain as mentioned above. History of recurrent kidney stones. Negative for CVA negative GI other than hernia. Negative for DVT 14 point review of systems otherwise negative as pertains history of present illness other than as mentioned above.   Objective: Vital Signs: BP 131/72   Ht 5\' 10"  (1.778 m)   Wt 204 lb (92.5 kg)   BMI 29.27 kg/m   Physical Exam  Constitutional: He is oriented to person, place, and time. He appears well-developed and well-nourished.  HENT:  Head: Normocephalic and atraumatic.  Eyes: EOM are normal. Pupils are equal, round, and  reactive to light.  Neck: No tracheal deviation present. No thyromegaly present.  Cardiovascular: Normal rate.   Pulmonary/Chest: Effort normal. He has no wheezes.  Abdominal: Soft. Bowel sounds are normal.  Musculoskeletal:  Patient's a majority without cane or walker. He has good hip range of motion without pain or limitation in internal rotation negative Pearlean Brownie test these reach full extension. Anterior tib EHL is strong. He has some limitation of cervical rotation right and left with mild discomfort. No numbness or tingling in the hands intact sensation. No rash over exposed skin. No pitting edema. Negative Homan.  Neurological: He is alert  and oriented to person, place, and time.  Skin: Skin is warm and dry. Capillary refill takes less than 2 seconds.  Psychiatric: He has a normal mood and affect. His behavior is normal. Judgment and thought content normal.    Ortho Exam  Specialty Comments:  No specialty comments available.  Imaging: Xr Lumbar Spine 2-3 Views  Result Date: 03/30/2017 AP lateral lumbar spine x-rays are obtained and reviewed that shows calcification in the abdominal aorta. Previous stent in place. There is some mild facet arthropathy without anterolisthesis and minimal disc space narrowing. Impression: Mild disc degeneration with facet arthropathy. Previous arterial stent    PMFS History: Patient Active Problem List   Diagnosis Date Noted  . Claudication in peripheral vascular disease (HCC) 12/14/2016  . Umbilical hernia-small supraumbilical 09/30/2012   Past Medical History:  Diagnosis Date  . Arthritis   . At risk for sleep apnea    STOP-BANG= 4        SENT TO PCP 11-15-2014  . Chronic constipation   . Chronic low back pain   . First degree heart block   . Hydronephrosis, right   . Hyperlipidemia   . Hypertension   . Hyperthyroidism   . Nephrolithiasis    bilateral -- right is obstructive  . Nocturia   . Right ureteral stone   . Rosacea   . Simple renal cyst    left    Family History  Problem Relation Age of Onset  . Heart disease Mother     Past Surgical History:  Procedure Laterality Date  . ABDOMINAL AORTOGRAM W/LOWER EXTREMITY N/A 12/15/2016   Procedure: Abdominal Aortogram w/Lower Extremity;  Surgeon: Ahjanae Cassel Decamp, MD;  Location: [facility] INVASIVE CV LAB;  Service: Cardiovascular;  Laterality: N/A;  . CYSTOSCOPY WITH RETROGRADE PYELOGRAM, URETEROSCOPY AND STENT PLACEMENT Right 03/12/2015   Procedure: CYSTO/RIGHT RETROGRADE PYELOGRAM/URETEROSCOPY/STONE EXTRACTION WITH BASKET AND RIGHT STENT PLACEMENT.;  Surgeon: Jerilee Field, MD;  Location: [facility];  Service: Urology;   Laterality: Right;  . CYSTOSCOPY WITH STENT PLACEMENT Right 11/20/2014   Procedure: CYSTOSCOPY WITH RIGHT RETROGRADE PYLEGRAM AND RIGHT URETERAL STENT PLACEMENT;  Surgeon: Jerilee Field, MD;  Location: [facility];  Service: Urology;  Laterality: Right;  . CYSTOSCOPY WITH URETEROSCOPY, STONE BASKETRY AND STENT PLACEMENT Right 12/18/2014   Procedure: CYSTOSCOPY WITH URETEROSCOPY, STONE BASKETRY AND STENT PLACEMENT;  Surgeon: Jerilee Field, MD;  Location: [facility];  Service: Urology;  Laterality: Right;  . HOLMIUM LASER APPLICATION Right 03/12/2015   Procedure: HOLMIUM LASER APPLICATION;  Surgeon: Jerilee Field, MD;  Location: [facility];  Service: Urology;  Laterality: Right;  . PERIPHERAL VASCULAR INTERVENTION  12/15/2016   Procedure: Peripheral Vascular Intervention;  Surgeon: Buren Havey Decamp, MD;  Location: [facility] INVASIVE CV LAB;  Service: Cardiovascular;;  RCIA  . VIDEO ASSISTED THORACOSCOPY (VATS)/DECORTICATION  2013   pneumonia   Social History  Occupational History  . Not on file.   Social History Main Topics  . Smoking status: Former Smoker    Packs/day: 2.00    Years: 40.00    Types: Cigarettes    Quit date: 09/28/1998  . Smokeless tobacco: Current User    Types: Snuff  . Alcohol use No  . Drug use: No  . Sexual activity: Not on file

## 2017-07-11 DIAGNOSIS — I209 Angina pectoris, unspecified: Secondary | ICD-10-CM | POA: Diagnosis present

## 2017-07-11 NOTE — H&P (Signed)
OFFICE VISIT NOTES COPIED TO EPIC FOR DOCUMENTATION  . History of Present Illness Gwinda Maine FNP-C; 06/24/2017 8:13 PM) Patient words: Last O/V 12/23/2016; 6 Mth F/U for PAD, ABI results, pt thinks he had a MI last Thursday, extreme cp took nitro which helped, chest feels sore now.  The patient is a 71 year old male who presents for a Follow-up for Peripheral vascular disease. Due to symptoms of severe claudication, he underwent peripheral arteriogram on 12/15/2016 and successful right iliac artery stenting. He had mild disease in the left iliac artery and there was three-vessel runoff low the knee bilaterally.  He was initially seen by me in November 2017 for chest pain suggestive of angina pectoris for which he has undergone nuclear stress test on 10/30/2016 which had revealed medium-sized mild lateral and inferolateral ischemia with preserved LVEF and was started on medical therapy since that had improved symptoms.  Patient now presents for 6 month follow up visit for PAD. He reports having an episode of chest pain last week while working in his yard. He states that pain was excruciating and was relieved with nitroglycerin and with lying down and resting. He has not had any reoccurence, but has had some chest soreness since this episode. Does have dyspnea on exertion. Denies further symptoms of claudication, has noticed significant improvement in right hip pain with activity. Repeat ABI's showed successful revascularization.   Problem List/Past Medical Frances Furbish Johnson; 06/23/2017 10:26 AM) History of kidney stones (Z61.096)  Essential hypertension, benign (I10)  Other dyspnea and respiratory abnormality (R06.09, R09.89)  Echo 02/04/12: Normal LVEF, Small cavity LV. Mild LVH, mild diastolic dysfunction. Lexiscan sestamibi 02/05/12: Normal perfusion without ischemia. Normal LVEF. Abnormal EKG; ECG 01/28/12 SR @ 77/min, TWI anterior V leads, normal intervals. Lower Venous doppler 01/28/12: no DVT left  leg. Mixed hyperlipidemia (E78.2)  Nonspecific abnormal electrocardiogram (ECG) (EKG) (R94.31)  Exertional chest pain (R07.9)  Lexiscan myoview stress test 10/30/2016: 1. The resting electrocardiogram demonstrated normal sinus rhythm, normal resting conduction, no resting arrhythmias and normal rest repolarization. Stress EKG is non-diagnostic for ischemia as it a pharmacologic stress using Lexiscan. Stress symptoms included dyspnea. 2. The perfusion imaging study demonstrates a medium-sized very mild ischemia in the lateral wall and inferolateral wall extending from the base towards the apex. Left ventricular systolic function calculated by QGS was 79%. This is an intermediate risk study, clinical correlation recommended. Right leg claudication (I73.9)  ABI 01/05/2017: This exam reveals mildly decreased perfusion of the right lower extremity, noted at the dorsalis pedis artery level with RABI 0.94 with triphasic waveforms (normal). This exam reveals normal perfusion of the left lower extremity with LABI 1.09. Compared to 11/18/2016, right ABI improved from 0.86. Represents successful revascularization. Screening for AAA (abdominal aortic aneurysm) (Z13.6)  Abdominal aortic duplex 11/18/2016: Diffuse plaque noted in the proximal, mid and distal aorta. No AAA observed.Peak velocity mildly elevated in the bilateral CIA suggesting <50% stenosis. Clinical correlation recommended. Labwork  12/10/2016: Creatinine 0.84, potassium 4.8, calcium slightly elevated at 10.7, BMP otherwise normal. CBC normal. INR 1.0, prothrombin time 10.4 Labs 04/06/16: Iron study normal. CBC normal. Potassium 3.6, creatinine 0.81, sodium 132, CMP normal. Cholesterol 133, triglycerides 238, HDL 41, LDL 44. TSH 0.96, free T4 0.7, vitamin B12 432. Hemoglobin A1c 6.2. Vitamin D 28. Labs 05/31/12 CBC, CMP, TSH normal. HbA1c 5.7. T. Chol 185, TG 206, HDL 51, LDL 93. NHDL 134. Smokeless tobacco use within past 30 days (Z72.0)   Allergies  Frances Furbish Johnson; 06/23/2017 10:26 AM) No  Known Drug Allergies [08/18/2012]:  Family History Cheri Kearns; 2017/07/10 10:26 AM) Mother  Deceased. at age 67 from unknown causes Father  Deceased. in his 17's from natural causes Sister 2  1-Deceased from a CVA; 1-Living, Older Brother 1  Deceased. 1 from [place] Frances Furbish Johnson; Jul 10, 2017 10:26 AM) Current tobacco use  Former smoker. Quit in 1999; Uses Dip Non Drinker/No Alcohol Use  Marital status  Widowed. Number of Children  2. Living Situation  Son lives with him  Past Surgical History Cheri Kearns; 2017/07/10 10:26 AM) None [11/05/2016]:  Medication History Anderson Malta Sergeant; 07-10-2017 10:34 AM) Lisinopril (10MG Tablet, 1 Oral daily, Taken starting 12/23/2016) Active. Nitrostat (0.4MG Tab Sublingual, 1 (one) Tablet Tablet Tablet Sublingual every 5 minutes as needed for chest pain., Taken starting 11/06/2016) Active. Anexsia (7.5-325MG Tablet, 1 Oral four times daily, as needed) Active. (Hydrocodone) Propylthiouracil (50MG Tablet, 1 Oral two times daily) Active. Hydrochlorothiazide (25MG Tablet, 1 Oral daily) Active. Vitamin D 1.80m (once a week) Active. Tamsulosin HCl (0.4MG Capsule, 1 Oral at bedtime) Active. Finasteride (5MG Tablet, 1 Oral daily) Active. Allopurinol (300MG Tablet, 1 Oral daily) Active. Aspirin (81MG Tablet, 1 Oral daily, before a meal) Active. Pravastatin Sodium (40MG Tablet, 1 Oral daily, in the evening) Active. Amitriptyline HCl (10MG Tablet, 1 Oral at bedtime) Active. Azithromycin (250MG Tablet, 1 Oral monday & thursday) Active. ALPRAZolam (0.5MG Tablet, 1 Oral at bedtime) Active. Clopidogrel Bisulfate (75MG Tablet, 1 Oral daily) Active. Diclofenac Sodium (1% Gel, Transdermal as needed) Active. Medications Reconciled (verbally with patient)  Diagnostic Studies History (April Garrison; 92018-10-137:59 AM) ABI's [01/05/2017]: This exam reveals mildly  decreased perfusion of the right lower extremity, noted at the dorsalis pedis artery level with RABI 0.94 with triphasic waveforms (normal). This exam reveals normal perfusion of the left lower extremity with LABI 1.09. Compared to 11/18/2016, right ABI improved from 0.86. Represents successful revascularization.  Other Problems (Frances FurbishJohnson; 9Oct 13, 201810:26 AM) Hospitalized for pneumonia in 1985.     Review of Systems (Gwinda Maine FNP-C; 06/24/2017 8:24 PM) General Present- Feeling well. Not Present- Tiredness and Unable to Sleep Lying Flat. HEENT Not Present- Blurred Vision. Respiratory Present- Difficulty Breathing on Exertion. Not Present- Bloody sputum and Wakes up from Sleep Wheezing or Short of Breath. Cardiovascular Present- Chest Pain. Not Present- Claudications, Edema, Leg Cramps, Palpitations and Paroxysmal Nocturnal Dyspnea. Gastrointestinal Not Present- Black, Tarry Stool, Bloody Stool and Heartburn. Musculoskeletal Present- Joint Stiffness. Not Present- Claudication and Joint Pain. Neurological Not Present- Focal Neurological Symptoms. Psychiatric Not Present- Personality Changes and Suicidal Ideation. Hematology Not Present- Blood Clots, Easy Bruising and Nose Bleed.  Vitals (Gwinda MaineFNP-C; 913-Oct-201810:44 AM) 910-13-1810:27 AM Weight: 211 lb Height: 70in Body Surface Area: 2.14 m Body Mass Index: 30.28 kg/m  Pulse: 90 (Regular)  P.OX: 95% (Room air) BP: 140/80 (Sitting, Left Arm, Standard)       Physical Exam (Gwinda Maine FNP-C; 06/24/2017 8:24 PM) General Mental Status-Alert. General Appearance-Cooperative and Appears stated age. Build & Nutrition-Moderately built and Mildly obese.  Head and Neck Thyroid Gland Characteristics - normal size and consistency and no palpable nodules.  Chest and Lung Exam Chest and lung exam reveals -quiet, even and easy respiratory effort with no use of accessory muscles, non-tender  and on auscultation, normal breath sounds, no adventitious sounds.  Cardiovascular Auscultation Rhythm - Regular. Heart Sounds - Normal heart sounds. Murmurs & Other Heart Sounds: Murmur - Location - Aortic Area. Timing - Early systolic. Grade -  II/VI.  Abdomen Palpation/Percussion Normal exam - Non Tender and No hepatosplenomegaly.  Peripheral Vascular Lower Extremity Inspection - Bilateral - Inspection Normal. Palpation - Edema - Bilateral - No edema. Femoral pulse - Bilateral - 2+. Popliteal pulse - Bilateral - 1+. Dorsalis pedis pulse - Bilateral - Normal. Posterior tibial pulse - Bilateral - 1+. Carotid arteries - Bilateral-No Carotid bruit.  Neurologic Neurologic evaluation reveals -alert and oriented x 3 with no impairment of recent or remote memory. Motor-Grossly intact without any focal deficits.  Musculoskeletal Global Assessment Left Lower Extremity - no deformities, masses or tenderness, no known fractures. Right Lower Extremity - no deformities, masses or tenderness, no known fractures.    Assessment & Plan Gwinda Maine FNP-C; 06/24/2017 8:24 PM) Exertional chest pain (R07.9) Story: Lexiscan myoview stress test 10/30/2016: 1. The resting electrocardiogram demonstrated normal sinus rhythm, normal resting conduction, no resting arrhythmias and normal rest repolarization. Stress EKG is non-diagnostic for ischemia as it a pharmacologic stress using Lexiscan. Stress symptoms included dyspnea. 2. The perfusion imaging study demonstrates a medium-sized very mild ischemia in the lateral wall and inferolateral wall extending from the base towards the apex. Left ventricular systolic function calculated by QGS was 79%. This is an intermediate risk study, clinical correlation recommended.   History of peripheral arterial disease (Z86.79) Story: ABI 01/05/2017: This exam reveals mildly decreased perfusion of the right lower extremity, noted at the dorsalis pedis artery  level with RABI 0.94 with triphasic waveforms (normal). This exam reveals normal perfusion of the left lower extremity with LABI 1.09. Compared to 11/18/2016, right ABI improved from 0.86. Represents successful revascularization. Impression: Peripheral arteriogram 12/15/2016: Right iliac stent 10.0x29 mm Omnilink Balloon expandable stent, 90% to 0%. Mild disease left and 3 vessel r/o at ankle bilateral Labwork Story: 07/08/2017: Creatinine 0.89, EGFR 86/99, potassium 5.0, BMP normal.  CBC normal.  INR 1.1, prothrombin time 11.0. 12/10/2016: Creatinine 0.84, potassium 4.8, calcium slightly elevated at 10.7, BMP otherwise normal. CBC normal. INR 1.0, prothrombin time 10.4  Labs 04/06/16: Iron study normal. CBC normal. Potassium 3.6, creatinine 0.81, sodium 132, CMP normal. Cholesterol 133, triglycerides 238, HDL 41, LDL 44. TSH 0.96, free T4 0.7, vitamin B12 432. Hemoglobin A1c 6.2. Vitamin D 28.  Labs 05/31/12 CBC, CMP, TSH normal. HbA1c 5.7. T. Chol 185, TG 206, HDL 51, LDL 93. NHDL 134. Smokeless tobacco use within past 30 days (Z72.0) Essential hypertension, benign (I10) Impression: EKG 06/24/2017: Sinus rhythm with first-degree block at the rate of 88 bpm, left atrial enlargement, nonspecific T abnormality. Low-voltage complexes. No significant change from EKG 12/23/2016. Current Plans Continued Lisinopril 10MG, 1 daily, 06/23/2017, No Refill. Complete electrocardiogram (93000) Started Carvedilol 3.125MG, 1 (one) Tablet two times daily, #30, 30 days starting 06/23/2017, Ref. x1. Local Order: decreased previous dose Note:. Recommendations:  Patient presents for 3 month follow-up visit for PAD. Recent ABI results were discussed with patient was reassured. Continues to have normal perfusion of left lower extremity and successful revascularization of the right lower extremity. He has not had any further episodes of claudication. No changes noted vascular exam. No changes noted to vascular  exam.  Patients recent episode of exertional chest pain and dyspnea on exertion are concerning for coronary disease. In view of abnormal nuclear stress testing, would recommend proceeding with coronary angiogram for further evaluation. Schedule for cardiac catheterization, and possible angioplasty. We discussed regarding risks, benefits, alternatives to this including stress testing, CTA and continued medical therapy. Patient wants to proceed. Understands <1-2% risk of death, stroke,  MI, urgent CABG, bleeding, infection, renal failure but not limited to these. He is noted to be hypertensive today, will resume Carvedilol both for hypertension and cardiac protection. Discussed the importance of cessation from smokeless tobacco and will further discuss at his follow up appointment. We will see him back after the procedure for further recommendations.  *I have discussed this case with Dr. Virgina Jock and he personally examined the patient and participated in formulating the plan.*  CC :Dr. Tamsen Roers  Signed electronically by Gwinda Maine, FNP-C (06/24/2017 8:24 PM)

## 2017-07-13 ENCOUNTER — Encounter (HOSPITAL_COMMUNITY): Payer: Self-pay

## 2017-07-13 ENCOUNTER — Other Ambulatory Visit: Payer: Self-pay

## 2017-07-13 ENCOUNTER — Ambulatory Visit (HOSPITAL_COMMUNITY)
Admission: RE | Disposition: A | Discharge status: Home / Self Care | Payer: Self-pay | Source: Ambulatory Visit | Attending: Cardiology

## 2017-07-13 ENCOUNTER — Ambulatory Visit (HOSPITAL_COMMUNITY)
Admission: RE | Admit: 2017-07-13 | Discharge: 2017-07-14 | Disposition: A | Discharge status: Home / Self Care | Payer: Medicare Other | Source: Ambulatory Visit | Attending: Cardiology | Admitting: Cardiology

## 2017-07-13 DIAGNOSIS — I214 Non-ST elevation (NSTEMI) myocardial infarction: Secondary | ICD-10-CM

## 2017-07-13 DIAGNOSIS — Z7982 Long term (current) use of aspirin: Secondary | ICD-10-CM | POA: Insufficient documentation

## 2017-07-13 DIAGNOSIS — I2510 Atherosclerotic heart disease of native coronary artery without angina pectoris: Secondary | ICD-10-CM

## 2017-07-13 DIAGNOSIS — I739 Peripheral vascular disease, unspecified: Secondary | ICD-10-CM | POA: Diagnosis not present

## 2017-07-13 DIAGNOSIS — Z87891 Personal history of nicotine dependence: Secondary | ICD-10-CM | POA: Diagnosis not present

## 2017-07-13 DIAGNOSIS — E782 Mixed hyperlipidemia: Secondary | ICD-10-CM | POA: Insufficient documentation

## 2017-07-13 DIAGNOSIS — Z9861 Coronary angioplasty status: Secondary | ICD-10-CM

## 2017-07-13 DIAGNOSIS — I209 Angina pectoris, unspecified: Secondary | ICD-10-CM | POA: Diagnosis present

## 2017-07-13 DIAGNOSIS — I25119 Atherosclerotic heart disease of native coronary artery with unspecified angina pectoris: Secondary | ICD-10-CM | POA: Diagnosis present

## 2017-07-13 DIAGNOSIS — I10 Essential (primary) hypertension: Secondary | ICD-10-CM | POA: Diagnosis not present

## 2017-07-13 DIAGNOSIS — I2584 Coronary atherosclerosis due to calcified coronary lesion: Secondary | ICD-10-CM | POA: Diagnosis not present

## 2017-07-13 DIAGNOSIS — Z955 Presence of coronary angioplasty implant and graft: Secondary | ICD-10-CM

## 2017-07-13 HISTORY — PX: CORONARY STENT INTERVENTION: CATH118234

## 2017-07-13 HISTORY — PX: LEFT HEART CATH AND CORONARY ANGIOGRAPHY: CATH118249

## 2017-07-13 LAB — POCT ACTIVATED CLOTTING TIME
ACTIVATED CLOTTING TIME: 290 s
ACTIVATED CLOTTING TIME: 285 s
ACTIVATED CLOTTING TIME: 318 s
Activated Clotting Time: 257 seconds

## 2017-07-13 LAB — TROPONIN I: Troponin I: <0.03 ng/mL (ref <0.03)

## 2017-07-13 LAB — MRSA PCR SCREENING: MRSA by PCR: NEGATIVE

## 2017-07-13 SURGERY — LEFT HEART CATH AND CORONARY ANGIOGRAPHY
Anesthesia: LOCAL

## 2017-07-13 MED ORDER — ASPIRIN 81 MG PO CHEW: 81.0000 mg | CHEWABLE_TABLET | ORAL | Status: DC

## 2017-07-13 MED ORDER — POLYETHYLENE GLYCOL 3350 17 G PO PACK: 17.0000 g | PACK | Freq: Every day | ORAL | Status: DC | PRN

## 2017-07-13 MED ORDER — FENTANYL CITRATE (PF) 100 MCG/2ML IJ SOLN
INTRAMUSCULAR | Status: AC
  Filled 2017-07-13: qty 2

## 2017-07-13 MED ORDER — SODIUM CHLORIDE 0.9 % WEIGHT BASED INFUSION
3.0000 mL/kg/h | INTRAVENOUS | Status: DC
  Administered 2017-07-13: 3 mL/kg/h via INTRAVENOUS

## 2017-07-13 MED ORDER — VITAMIN D (ERGOCALCIFEROL) 1.25 MG (50000 UNIT) PO CAPS
50000.0000 [IU] | ORAL_CAPSULE | ORAL | Status: DC
  Administered 2017-07-13: 50000 [IU] via ORAL
  Filled 2017-07-13: qty 1

## 2017-07-13 MED ORDER — ASPIRIN EC 81 MG PO TBEC
81.0000 mg | DELAYED_RELEASE_TABLET | Freq: Every day | ORAL | Status: DC
  Administered 2017-07-13 – 2017-07-14 (×2): 81 mg via ORAL
  Filled 2017-07-13 (×2): qty 1

## 2017-07-13 MED ORDER — ACETAMINOPHEN 325 MG PO TABS: 650.0000 mg | ORAL_TABLET | ORAL | Status: DC | PRN

## 2017-07-13 MED ORDER — TICAGRELOR 90 MG PO TABS
ORAL_TABLET | ORAL | Status: DC | PRN
  Administered 2017-07-13: 180 mg via ORAL

## 2017-07-13 MED ORDER — IOPAMIDOL (ISOVUE-370) INJECTION 76%
INTRAVENOUS | Status: AC
  Filled 2017-07-13: qty 100

## 2017-07-13 MED ORDER — LISINOPRIL 10 MG PO TABS
10.0000 mg | ORAL_TABLET | Freq: Every morning | ORAL | Status: DC
  Administered 2017-07-14: 10 mg via ORAL
  Filled 2017-07-13: qty 1

## 2017-07-13 MED ORDER — IOPAMIDOL (ISOVUE-370) INJECTION 76%
INTRAVENOUS | Status: AC
  Filled 2017-07-13: qty 50

## 2017-07-13 MED ORDER — LABETALOL HCL 5 MG/ML IV SOLN: 10.0000 mg | INTRAVENOUS | Status: AC | PRN

## 2017-07-13 MED ORDER — VERAPAMIL HCL 2.5 MG/ML IV SOLN
INTRAVENOUS | Status: DC | PRN
  Administered 2017-07-13: 200 ug via INTRACORONARY

## 2017-07-13 MED ORDER — HYDROCODONE-ACETAMINOPHEN 7.5-325 MG PO TABS
1.0000 | ORAL_TABLET | Freq: Four times a day (QID) | ORAL | Status: DC
  Administered 2017-07-13 – 2017-07-14 (×3): 1 via ORAL
  Filled 2017-07-13 (×3): qty 1

## 2017-07-13 MED ORDER — TAMSULOSIN HCL 0.4 MG PO CAPS
0.4000 mg | ORAL_CAPSULE | Freq: Every day | ORAL | Status: DC
  Administered 2017-07-13: 0.4 mg via ORAL
  Filled 2017-07-13: qty 1

## 2017-07-13 MED ORDER — PROPYLTHIOURACIL 50 MG PO TABS
50.0000 mg | ORAL_TABLET | Freq: Two times a day (BID) | ORAL | Status: DC
Start: 2017-07-13 — End: 2017-07-14
  Administered 2017-07-13 – 2017-07-14 (×2): 50 mg via ORAL
  Filled 2017-07-13 (×2): qty 1

## 2017-07-13 MED ORDER — AMITRIPTYLINE HCL 10 MG PO TABS
10.0000 mg | ORAL_TABLET | Freq: Every day | ORAL | Status: DC
  Administered 2017-07-13: 10 mg via ORAL
  Filled 2017-07-13: qty 1

## 2017-07-13 MED ORDER — ONDANSETRON HCL 4 MG/2ML IJ SOLN: 4.0000 mg | Freq: Four times a day (QID) | INTRAMUSCULAR | Status: DC | PRN

## 2017-07-13 MED ORDER — NITROGLYCERIN IN D5W 200-5 MCG/ML-% IV SOLN
INTRAVENOUS | Status: AC | PRN
  Administered 2017-07-13: 10 ug/min via INTRAVENOUS

## 2017-07-13 MED ORDER — FENTANYL CITRATE (PF) 100 MCG/2ML IJ SOLN
INTRAMUSCULAR | Status: DC | PRN
  Administered 2017-07-13 (×3): 50 ug via INTRAVENOUS

## 2017-07-13 MED ORDER — HYDRALAZINE HCL 20 MG/ML IJ SOLN: 5.0000 mg | INTRAMUSCULAR | Status: AC | PRN

## 2017-07-13 MED ORDER — CARVEDILOL 3.125 MG PO TABS
3.1250 mg | ORAL_TABLET | Freq: Two times a day (BID) | ORAL | Status: DC
  Administered 2017-07-14: 3.125 mg via ORAL
  Filled 2017-07-13: qty 1

## 2017-07-13 MED ORDER — SODIUM CHLORIDE 0.9% FLUSH
3.0000 mL | INTRAVENOUS | Status: DC | PRN
Start: 2017-07-13 — End: 2017-07-14

## 2017-07-13 MED ORDER — MIDAZOLAM HCL 2 MG/2ML IJ SOLN
INTRAMUSCULAR | Status: AC
  Filled 2017-07-13: qty 2

## 2017-07-13 MED ORDER — PRAVASTATIN SODIUM 40 MG PO TABS
80.0000 mg | ORAL_TABLET | Freq: Every day | ORAL | Status: DC
  Administered 2017-07-13: 80 mg via ORAL
  Filled 2017-07-13: qty 2

## 2017-07-13 MED ORDER — SODIUM CHLORIDE 0.9 % WEIGHT BASED INFUSION: 1.0000 mL/kg/h | INTRAVENOUS | Status: DC

## 2017-07-13 MED ORDER — SODIUM CHLORIDE 0.9% FLUSH
3.0000 mL | Freq: Two times a day (BID) | INTRAVENOUS | Status: DC
  Administered 2017-07-14: 3 mL via INTRAVENOUS

## 2017-07-13 MED ORDER — NITROGLYCERIN 1 MG/10 ML FOR IR/CATH LAB
INTRA_ARTERIAL | Status: DC | PRN
  Administered 2017-07-13: 200 ug via INTRACORONARY
  Administered 2017-07-13: 400 ug via INTRACORONARY
  Administered 2017-07-13: 200 ug via INTRACORONARY

## 2017-07-13 MED ORDER — HYDROCHLOROTHIAZIDE 25 MG PO TABS
25.0000 mg | ORAL_TABLET | Freq: Every morning | ORAL | Status: DC
  Administered 2017-07-14: 25 mg via ORAL
  Filled 2017-07-13: qty 1

## 2017-07-13 MED ORDER — TICAGRELOR 90 MG PO TABS
ORAL_TABLET | ORAL | Status: AC
  Filled 2017-07-13: qty 1

## 2017-07-13 MED ORDER — SODIUM CHLORIDE 0.9 % WEIGHT BASED INFUSION
1.0000 mL/kg/h | INTRAVENOUS | Status: AC
  Administered 2017-07-14: 1 mL/kg/h via INTRAVENOUS

## 2017-07-13 MED ORDER — HEPARIN SODIUM (PORCINE) 1000 UNIT/ML IJ SOLN
INTRAMUSCULAR | Status: AC
  Filled 2017-07-13: qty 1

## 2017-07-13 MED ORDER — IOPAMIDOL (ISOVUE-370) INJECTION 76%
INTRAVENOUS | Status: DC | PRN
  Administered 2017-07-13: 210 mL via INTRAVENOUS

## 2017-07-13 MED ORDER — NITROGLYCERIN 0.4 MG SL SUBL: 0.4000 mg | SUBLINGUAL_TABLET | SUBLINGUAL | Status: DC | PRN

## 2017-07-13 MED ORDER — ALLOPURINOL 300 MG PO TABS
300.0000 mg | ORAL_TABLET | Freq: Every morning | ORAL | Status: DC
  Administered 2017-07-14: 300 mg via ORAL
  Filled 2017-07-13: qty 1

## 2017-07-13 MED ORDER — SODIUM CHLORIDE 0.9% FLUSH: 3.0000 mL | INTRAVENOUS | Status: DC | PRN

## 2017-07-13 MED ORDER — FINASTERIDE 5 MG PO TABS
5.0000 mg | ORAL_TABLET | Freq: Every day | ORAL | Status: DC
  Administered 2017-07-14: 5 mg via ORAL
  Filled 2017-07-13: qty 1

## 2017-07-13 MED ORDER — TICAGRELOR 90 MG PO TABS
90.0000 mg | ORAL_TABLET | Freq: Two times a day (BID) | ORAL | Status: DC
  Administered 2017-07-13 – 2017-07-14 (×2): 90 mg via ORAL
  Filled 2017-07-13 (×2): qty 1

## 2017-07-13 MED ORDER — LIDOCAINE HCL 2 % IJ SOLN
INTRAMUSCULAR | Status: DC | PRN
  Administered 2017-07-13: 2 mL

## 2017-07-13 MED ORDER — LIDOCAINE HCL 2 % IJ SOLN
INTRAMUSCULAR | Status: AC
  Filled 2017-07-13: qty 10

## 2017-07-13 MED ORDER — MIDAZOLAM HCL 2 MG/2ML IJ SOLN
INTRAMUSCULAR | Status: DC | PRN
  Administered 2017-07-13 (×2): 1 mg via INTRAVENOUS

## 2017-07-13 MED ORDER — NITROGLYCERIN IN D5W 200-5 MCG/ML-% IV SOLN
INTRAVENOUS | Status: AC
  Filled 2017-07-13: qty 250

## 2017-07-13 MED ORDER — VERAPAMIL HCL 2.5 MG/ML IV SOLN
INTRAVENOUS | Status: DC | PRN
  Administered 2017-07-13: 10 mL via INTRA_ARTERIAL

## 2017-07-13 MED ORDER — SODIUM CHLORIDE 0.9% FLUSH: 3.0000 mL | Freq: Two times a day (BID) | INTRAVENOUS | Status: DC

## 2017-07-13 MED ORDER — NITROGLYCERIN IN D5W 200-5 MCG/ML-% IV SOLN: 0.0000 ug/min | INTRAVENOUS | Status: DC

## 2017-07-13 MED ORDER — VERAPAMIL HCL 2.5 MG/ML IV SOLN
INTRAVENOUS | Status: AC
  Filled 2017-07-13: qty 2

## 2017-07-13 MED ORDER — SODIUM CHLORIDE 0.9 % IV SOLN: 250.0000 mL | INTRAVENOUS | Status: DC | PRN

## 2017-07-13 MED ORDER — AZITHROMYCIN 250 MG PO TABS: 250.0000 mg | ORAL_TABLET | ORAL | Status: DC

## 2017-07-13 MED ORDER — HEPARIN SODIUM (PORCINE) 1000 UNIT/ML IJ SOLN
INTRAMUSCULAR | Status: DC | PRN
  Administered 2017-07-13: 3000 [IU] via INTRAVENOUS
  Administered 2017-07-13: 6000 [IU] via INTRAVENOUS
  Administered 2017-07-13: 1000 [IU] via INTRAVENOUS
  Administered 2017-07-13: 2000 [IU] via INTRAVENOUS
  Administered 2017-07-13: 3000 [IU] via INTRAVENOUS

## 2017-07-13 MED ORDER — SODIUM CHLORIDE 0.9 % IV SOLN
250.0000 mL | INTRAVENOUS | Status: DC | PRN
  Administered 2017-07-13: 500 mL via INTRAVENOUS

## 2017-07-13 MED ORDER — HEPARIN (PORCINE) IN NACL 2-0.9 UNIT/ML-% IJ SOLN
INTRAMUSCULAR | Status: AC
  Filled 2017-07-13: qty 1000

## 2017-07-13 MED ORDER — FENTANYL CITRATE (PF) 100 MCG/2ML IJ SOLN
50.0000 ug | INTRAMUSCULAR | Status: DC | PRN
  Administered 2017-07-13: 50 ug via INTRAVENOUS

## 2017-07-13 MED ORDER — NITROGLYCERIN 1 MG/10 ML FOR IR/CATH LAB
INTRA_ARTERIAL | Status: AC
  Filled 2017-07-13: qty 10

## 2017-07-13 SURGICAL SUPPLY — 21 items
BALLN SAPPHIRE 2.5X20 (BALLOONS) ×2
BALLN SPRINTER MX OTW 1.5X12 (BALLOONS) ×2
BALLOON SAPPHIRE 2.5X20 (BALLOONS) ×1 IMPLANT
BALLOON SPRINTER MX OTW 1.5X12 (BALLOONS) ×1 IMPLANT
CATH LAUNCHER 6FR EBU3.5 (CATHETERS) ×2 IMPLANT
CATH OPTITORQUE TIG 4.0 5F (CATHETERS) ×2 IMPLANT
CROWN DIAMONDBACK CLASSIC 1.25 (BURR) ×2 IMPLANT
DEVICE RAD COMP TR BAND LRG (VASCULAR PRODUCTS) ×4 IMPLANT
ELECT DEFIB PAD ADLT CADENCE (PAD) ×2 IMPLANT
GLIDESHEATH SLEND A-KIT 6F 20G (SHEATH) ×2 IMPLANT
GUIDEWIRE INQWIRE 1.5J.035X260 (WIRE) ×1 IMPLANT
INQWIRE 1.5J .035X260CM (WIRE) ×2
KIT ENCORE 26 ADVANTAGE (KITS) ×2 IMPLANT
KIT HEART LEFT (KITS) ×2 IMPLANT
LUBRICANT VIPERSLIDE CORONARY (MISCELLANEOUS) ×2 IMPLANT
PACK CARDIAC CATHETERIZATION (CUSTOM PROCEDURE TRAY) ×2 IMPLANT
STENT SIERRA 3.00 X 23 MM (Permanent Stent) ×2 IMPLANT
TRANSDUCER W/STOPCOCK (MISCELLANEOUS) ×2 IMPLANT
TUBING CIL FLEX 10 FLL-RA (TUBING) ×2 IMPLANT
WIRE COUGAR XT STRL 300CM (WIRE) ×2 IMPLANT
WIRE VIPER ADVANCE COR .012TIP (WIRE) ×4 IMPLANT

## 2017-07-13 NOTE — Progress Notes (Signed)
Patient's chest pain still remaining at an 8/10 after receiving fentanyl and IV nitro drip was increased to 20 mcg/min. MD was notified. MD ordered for patient to take scheduled dose of Norco. Patient received scheduled dose of Norco and after reassessing patients pain he stated that chest pain felt better at a 6/10. Will continue to monitor. Patients VS are still WNL's.

## 2017-07-13 NOTE — Interval H&P Note (Signed)
History and Physical Interval Note:  07/13/2017 10:41 AM  Phillip Wong  has presented today for surgery, with the diagnosis of cp  The various methods of treatment have been discussed with the patient and family. After consideration of risks, benefits and other options for treatment, the patient has consented to  Procedure(s): LEFT HEART CATH AND CORONARY ANGIOGRAPHY (N/A) and possible PCI  as a surgical intervention .  The patient's history has been reviewed, patient examined, no change in status, stable for surgery.  I have reviewed the patient's chart and labs.  Questions were answered to the patient's satisfaction.   Symptom Status: Ischemic Symptoms Non-invasive Testing: Indeterminate If no or indeterminate stress test, FFR/iFR results in all diseased vessels: Not done Diabetes Mellitus: No S/P CABG: No Antianginal therapy (number of long-acting drugs): >=2 Patient undergoing renal transplant: No Patient undergoing percutaneous valve procedure: No   1 Vessel Disease No proximal LAD involvement, No proximal left dominant LCX involvement  PCI: Not rated  CABG: Not rated Proximal left dominant LCX involvement  PCI: Not rated  CABG: Not rated Proximal LAD involvement  PCI: Not rated  CABG: Not rated  2 Vessel Disease No proximal LAD involvement  PCI: Not rated  CABG: Not rated Proximal LAD involvement  PCI: Not rated  CABG: Not rated  3 Vessel Disease Low disease complexity (e.g., focal stenoses, SYNTAX <=22)  PCI: Not rated  CABG: Not rated Intermediate or high disease complexity (e.g., SYNTAX >=23)  PCI: Not rated  CABG: Not rated  Left Main Disease Isolated LMCA disease: ostial or midshaft  PCI: A (7);  Indication 24  CABG: A (9);  Indication 24 Isolated LMCA disease: bifurcation involvement  PCI: M (6);  Indication 25  CABG: A (9);  Indication 25 LMCA ostial or midshaft, concurrent low disease burden multivessel disease (e.g., 1-2 additional focal stenoses,  SYNTAX <=22)  PCI: A (7);  Indication 26  CABG: A (9);  Indication 26 LMCA ostial or midshaft, concurrent intermediate or high disease burden multivessel disease (e.g., 1-2 additional bifurcation stenoses, long stenoses, SYNTAX >=23)  PCI: M (4);  Indication 27  CABG: A (9);  Indication 27 LMCA bifurcation involvement, concurrent low disease burden multivessel disease (e.g., 1-2 additional focal stenoses, SYNTAX <=22)  PCI: M (6);  Indication 28  CABG: A (9);  Indication 28 LMCA bifurcation involvement, concurrent intermediate or high disease burden multivessel disease (e.g., 1-2 additional bifurcation stenoses, long stenoses, SYNTAX >=23)  PCI: R (3);  Indication 29  CABG: A (9);  Indication 29  Notes:  A indicates appropriate. M indicates may be appropriate. R indicates rarely appropriate. Number in parentheses is median score for that indication. Reclassify indicates number of functionally diseased vessels should be decreased given negative FFR/iFR. Re-evaluate the scenario interpreting any FFR/iFR negative vessel as being not significantly stenosed.  Disease means involved vessel provides flow to a sufficient amount of myocardium to be clinically important.  If FFR testing indicates a vessel is not significant, that vessel should not be considered diseased (and the patient should be reclassified with respect to extent of functionally significant disease).  Proximal LAD + proximal left dominant LCX is considered 3 vessel CAD  2 Vessel CAD with FFR/iFR abnormal in only 1 but not both is considered 1 vessel CAD  Disease complexity includes occlusion, bifurcation, trifurcation, ostial, >20 mm, tortuosity, calcification, thrombus  LMCA disease is >=50% by angiography, MLD <2.8 mm, MLA <6 mm2; MLA 6-7.5 mm2 requires further physiologic  See Table B  for risk stratification based on noninvasive testing  Journal of the [employer] of Cardiology Mar 2017, 23391; DOI:  10.1016/j.jacc.2017.02.001 [email or web address].de.[email or web address].full-text.pdf This App  2018 by the Society for Cardiovascular Angiography and Interventions   Adrian Prows

## 2017-07-13 NOTE — Progress Notes (Signed)
Patient complaining of chest pain 8/10 post procedure. EKG was preformed at the bedside. Patient has 2L of oxygen on an MD was notified. MD ordered 50 mcg of Fentanyl q2h PRN. Patient received Fentanyl. MD came to see patient at bedside. Patient VS were WNL's. Will continue to monitor patient.

## 2017-07-14 ENCOUNTER — Encounter (HOSPITAL_COMMUNITY): Payer: Self-pay | Admitting: Cardiology

## 2017-07-14 DIAGNOSIS — I2584 Coronary atherosclerosis due to calcified coronary lesion: Secondary | ICD-10-CM | POA: Diagnosis not present

## 2017-07-14 DIAGNOSIS — I10 Essential (primary) hypertension: Secondary | ICD-10-CM | POA: Diagnosis not present

## 2017-07-14 DIAGNOSIS — I25119 Atherosclerotic heart disease of native coronary artery with unspecified angina pectoris: Secondary | ICD-10-CM | POA: Diagnosis not present

## 2017-07-14 DIAGNOSIS — E782 Mixed hyperlipidemia: Secondary | ICD-10-CM | POA: Diagnosis not present

## 2017-07-14 LAB — BASIC METABOLIC PANEL
ANION GAP: 8 (ref 5–15)
BUN: 10 mg/dL (ref 6–20)
CALCIUM: 8.2 mg/dL — AB (ref 8.9–10.3)
CO2: 22 mmol/L (ref 22–32)
Chloride: 102 mmol/L (ref 101–111)
Creatinine, Ser: 0.75 mg/dL (ref 0.61–1.24)
GFR calc Af Amer: >60 mL/min (ref >60)
GFR calc non Af Amer: >60 mL/min (ref >60)
GLUCOSE: 103 mg/dL — AB (ref 65–99)
Potassium: 3.7 mmol/L (ref 3.5–5.1)
Sodium: 132 mmol/L — ABNORMAL LOW (ref 135–145)

## 2017-07-14 LAB — CBC
HEMATOCRIT: 36.5 % — AB (ref 39.0–52.0)
HEMOGLOBIN: 12.5 g/dL — AB (ref 13.0–17.0)
MCH: 31.1 pg (ref 26.0–34.0)
MCHC: 34.2 g/dL (ref 30.0–36.0)
MCV: 90.8 fL (ref 78.0–100.0)
Platelets: 178 10*3/uL (ref 150–400)
RBC: 4.02 MIL/uL — ABNORMAL LOW (ref 4.22–5.81)
RDW: 14 % (ref 11.5–15.5)
WBC: 7.8 10*3/uL (ref 4.0–10.5)

## 2017-07-14 LAB — TROPONIN I
TROPONIN I: 0.33 ng/mL — AB (ref <0.03)
Troponin I: 0.66 ng/mL (ref <0.03)

## 2017-07-14 MED ORDER — TICAGRELOR 90 MG PO TABS: 90.0000 mg | ORAL_TABLET | Freq: Two times a day (BID) | ORAL | 0 refills | Status: DC

## 2017-07-14 MED FILL — Heparin Sodium (Porcine) 2 Unit/ML in Sodium Chloride 0.9%: INTRAMUSCULAR | Qty: 500 | Status: AC

## 2017-07-14 NOTE — Progress Notes (Signed)
Patient requested paperwork for HCPOA.  Note said patient didn't want to discuss it.  Education about how to complete it was given.  Patient will call with further instructions or if he desires to complete.    07/14/17 [id number]  Clinical Encounter Type  Visited With Patient  Visit Type Initial;Social support;Spiritual support

## 2017-07-14 NOTE — Progress Notes (Signed)
CARDIAC REHAB PHASE I   PRE:  Rate/Rhythm: 74 SR  BP:  Sitting: 142/72        SaO2: 93 RA  MODE:  Ambulation: 600 ft   POST:  Rate/Rhythm: 82 SR  BP:  Sitting: 133/66          SaO2: 95 RA  Pt ambulated 600 ft on RA, handheld assist, steady gait, tolerated fairly well.  Pt c/o mild DOE, denies cp, dizziness, standing rest x3. Completed MI/stent education with pt and son at bedside.  Reviewed risk factors, tobacco cessation, decrease in ETOH (states he drink at least 4 beers a night, history of heavy ETOH prior), MI book, anti-platelet therapy, stent card, activity restrictions, ntg, exercise, heart healthy diet and phase 2 cardiac rehab. Pt verbalized understanding, limited receptiveness to education regarding lifestyle/risk factor modifications, pt very eager to go home and "get back to work." Pt agrees to phase 2 cardiac rehab referral, [facility]. Pt up ad lib in room, eagerly awaiting discharge.  [id number] Joylene GrapesEmily C Keyontae Huckeby, RN, BSN 07/14/2017 11:41 AM

## 2017-07-14 NOTE — Progress Notes (Addendum)
CRITICAL VALUE ALERT  Critical Value:  Troponin 0.33  Date & Time Notied: 07/14/17 0151  Provider Notified: Dr. Jacinto HalimGanji  Orders Received/Actions taken: No new orders

## 2017-07-14 NOTE — Discharge Summary (Signed)
Physician Discharge Summary  Patient ID: Phillip Wong MRN: [id number] DOB/AGE: 71/01/1946 71 y.o.  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 Stent jailed small AV grove Circumflex leading to periprocedural NSTEMI  Discharged Condition: good  Hospital Course: Patient was admitted to the hospital for elective coronary angiography in view of ongoing symptoms of recurrent angina pectoris which was lifestyle limiting.  Angiography revealed moderate disease in RCA, normal LVEF but a high-grade calcific tandem significant stenosis in the proximal and mid circumflex coronary artery with severe calcification.  He underwent successful angioplasty after CSI atherectomy, however there was loss of A-V groove branch of the circumflex coronary artery which gave him chest pain and also very mild leak in troponin.  After 4-6 hours, patient essentially remained asymptomatic the following morning he was felt to be stable for discharge.  Plavix was discontinued, switched to Brilinta.  Patient does chew smokeless tobacco, strict abstinence has been discussed  Consults: None  Significant Diagnostic Studies: angiography: 07/13/2017: Normal LVEF, mild disease LAD,calcified, moderate disease RCA, calcified mid 50% stenosis, large circumflex withmaximal and mid tandem calcific 80-90% stenosis. S/PDES placement after atherectomy.  EKG 07/14/2017: Normal sinus rhythm at the rate of 70 bpm, normal axis, T-wave inversion in V1 to V3, cannot exclude anteroseptal ischemia.  Compared to  EKG dated 12/15/2016, T-wave inversion in V1 to V3 watchful prominent. basic  metabolic panel   Discharge Exam: Blood pressure 110/69, pulse 79, temperature 97.8 F (36.6 C), temperature source Oral, resp. rate (!) 21, height 5\' 10"  (1.778 m), weight 95.3 kg (210 lb), SpO2 97 %. General appearance: alert, cooperative, appears stated age, no distress and moderately obese Resp: clear to auscultation bilaterally Chest wall: no tenderness Cardio: regular rate and rhythm, no S3 or S4 and systolic murmur: early systolic 2/6, crescendo at 2nd right intercostal space GI: soft, non-tender; bowel sounds normal; no masses,  no organomegaly and obese Extremities: extremities normal, atraumatic, no cyanosis or edema Pulses: 2+ and symmetric bilateral carotid bruit present. Right radial access normal Neurologic: Grossly normal   BMP Latest Ref Rng & Units 07/14/2017 03/12/2015 12/18/2014  Glucose 65 - 99 mg/dL 213(Y103(H) 865(H127(H) 846(N140(H)  BUN 6 - 20 mg/dL 10 16 11   Creatinine 0.61 - 1.24 mg/dL 6.290.75 5.280.70 4.130.75  Sodium 135 - 145 mmol/L 132(L) 138 138  Potassium 3.5 - 5.1 mmol/L 3.7 3.7 4.1  Chloride 101 - 111 mmol/L 102 100(L) 103  CO2 22 - 32 mmol/L 22 - 26  Calcium 8.9 - 10.3 mg/dL 8.2(L) - 9.0    CBC    Component Value Date/Time   WBC 7.8 07/14/2017 0546   RBC 4.02 (L) 07/14/2017 0546   HGB 12.5 (L) 07/14/2017 0546   HCT 36.5 (L) 07/14/2017 0546   PLT 178 07/14/2017 0546   MCV 90.8 07/14/2017 0546   MCH 31.1 07/14/2017 0546   MCHC 34.2 07/14/2017 0546   RDW 14.0 07/14/2017 0546   LYMPHSABS 1.9 06/21/2014 1058   MONOABS 0.3 06/21/2014 1058   EOSABS 0.0 06/21/2014 1058   BASOSABS 0.0 06/21/2014 1058   Cardiac Panel (last 3 results)  Recent Labs  07/13/17 1807 07/14/17 0045 07/14/17 0546  TROPONINI <0.03 0.33* 0.66*    Disposition: 01-Home or Self  Care   Allergies as of 07/14/2017   No Known Allergies     Medication List    STOP taking these medications   clopidogrel 75 MG tablet Commonly known as:  PLAVIX     TAKE these medications   allopurinol 300  MG tablet Commonly known as:  ZYLOPRIM Take 300 mg by mouth every morning.   amitriptyline 10 MG tablet Commonly known as:  ELAVIL Take 10 mg by mouth at bedtime.   aspirin EC 81 MG tablet Take 1 tablet (81 mg total) by mouth daily.   azithromycin 250 MG tablet Commonly known as:  ZITHROMAX Take 250 mg by mouth See admin instructions. Take 250 mg by mouth twice weekly on Monday and Thursday   carvedilol 3.125 MG tablet Commonly known as:  COREG Take 3.125 mg by mouth 2 (two) times daily with a meal.   diclofenac sodium 1 % Gel Commonly known as:  VOLTAREN Apply 2 g topically daily as needed (for leg pain).   finasteride 5 MG tablet Commonly known as:  PROSCAR Take 5 mg by mouth daily after breakfast.   hydrochlorothiazide 25 MG tablet Commonly known as:  HYDRODIURIL Take 25 mg by mouth every morning.   HYDROcodone-acetaminophen 7.5-325 MG tablet Commonly known as:  NORCO Take 1 tablet by mouth 4 (four) times daily.   lisinopril 10 MG tablet Commonly known as:  PRINIVIL,ZESTRIL Take 10 mg by mouth every morning.   nitroGLYCERIN 0.4 MG SL tablet Commonly known as:  NITROSTAT Place 0.4 mg under the tongue every 5 (five) minutes as needed for chest pain.   polyethylene glycol packet Commonly known as:  MIRALAX / GLYCOLAX Take 17 g by mouth daily as needed for mild constipation or moderate constipation.   pravastatin 40 MG tablet Commonly known as:  PRAVACHOL Take 40 mg by mouth every morning.   propylthiouracil 50 MG tablet Commonly known as:  PTU Take 50 mg by mouth 2 (two) times daily.   tamsulosin 0.4 MG Caps capsule Commonly known as:  FLOMAX Take 0.4 mg by mouth at bedtime.   ticagrelor 90 MG Tabs tablet Commonly known as:  BRILINTA Take 1 tablet (90 mg total) by mouth 2 (two) times daily.   Vitamin D (Ergocalciferol) 50000 units Caps capsule Commonly known as:  DRISDOL Take 50,000 Units by mouth every Tuesday.      Follow-up Information     Yates Decamp, MD Follow up.   Specialty:  Cardiology Why:  Keep previous appointment Contact information: [address] [place] [address] [phone]           Signed: Yates Decamp 07/14/2017, 9:19 AM

## 2017-07-14 NOTE — Progress Notes (Signed)
Dr. Jacinto HalimGanji notified of need to have RX for Brilinta faxed to [facility] on [address] per request of Case Management.

## 2017-07-14 NOTE — Care Management Note (Signed)
Case Management Note  Patient Details  Name: Phillip Wong MRN: [id number] Date of Birth: 07/16/1946  Subjective/Objective:  Pt is Wong/p cardiac cath                  Action/Plan:  PTA from home with son - completely independent.  Pt has PCP and denies barriers to obtaining or paying for medications as prescribed.  CM submitted benefit check for Brilinta, CM provided pt free 30 day card.  CM was informed by [facility] that medication is not available for fill - CM contacted pts second choice [facility] on [address] - they can fill today.  CM requested that pts prescription be faxed to [facility] on [address].   Expected Discharge Date:  07/14/17               Expected Discharge Plan:  Home/Self Care  In-House Referral:     Discharge planning Services  CM Consult, Medication Assistance  Post Acute Care Choice:    Choice offered to:     DME Arranged:    DME Agency:     HH Arranged:    HH Agency:     Status of Service:  Completed, signed off  If discussed at [employer] Length of Stay Meetings, dates discussed:    Additional Comments:  Phillip Wong, Phillip Bowerman S, RN 07/14/2017, 9:50 AM

## 2017-07-16 ENCOUNTER — Telehealth (HOSPITAL_COMMUNITY): Payer: Self-pay

## 2017-07-16 NOTE — Telephone Encounter (Signed)
Verified insurance. [facility] - no co-payment, no deductible, out of pocket amount is $6,700/$0.00 has been met, no co-insurance, and no pre-authorization is required. Passport/reference # [phone]  Verified Insurance. Medicaid - No co-payment, no deductible, no out of pocket, no co-insurance, and no pre-authorization is required. Passport/reference # [id number]  Patient will be contacted and scheduled after follow up appt with the Cardiologist office upon review by the RN Navigator.

## 2017-07-27 NOTE — Telephone Encounter (Signed)
Called and spoke with patient in regards to Cardiac Rehab - He seemed unsure about the program but got him scheduled for orientation on 08/17/17. He will be attending the 11:15am exc class.

## 2017-08-13 ENCOUNTER — Telehealth (HOSPITAL_COMMUNITY): Payer: Self-pay | Admitting: Pharmacist

## 2017-08-13 NOTE — Telephone Encounter (Signed)
Cardiac Rehab - Pharmacy Resident Documentation   Patient unsure if he will be attending appointment, did not want to discuss medications with me at this time. Please complete allergy verification and medication review during patient's cardiac rehab appointment.

## 2017-08-16 ENCOUNTER — Telehealth (HOSPITAL_COMMUNITY): Payer: Self-pay | Admitting: *Deleted

## 2017-08-17 ENCOUNTER — Inpatient Hospital Stay (HOSPITAL_COMMUNITY): Admission: RE | Admit: 2017-08-17 | Payer: Medicare Other | Source: Ambulatory Visit

## 2017-08-23 ENCOUNTER — Ambulatory Visit (HOSPITAL_COMMUNITY): Payer: Medicare Other

## 2017-08-25 ENCOUNTER — Ambulatory Visit (HOSPITAL_COMMUNITY): Payer: Medicare Other

## 2017-08-27 ENCOUNTER — Ambulatory Visit (HOSPITAL_COMMUNITY): Payer: Medicare Other

## 2017-08-30 ENCOUNTER — Ambulatory Visit (HOSPITAL_COMMUNITY): Payer: Medicare Other

## 2017-09-01 ENCOUNTER — Ambulatory Visit (HOSPITAL_COMMUNITY): Payer: Medicare Other

## 2017-09-03 ENCOUNTER — Ambulatory Visit (HOSPITAL_COMMUNITY): Payer: Medicare Other

## 2017-09-06 ENCOUNTER — Ambulatory Visit (HOSPITAL_COMMUNITY): Payer: Medicare Other

## 2017-09-08 ENCOUNTER — Ambulatory Visit (HOSPITAL_COMMUNITY): Payer: Medicare Other

## 2017-09-10 ENCOUNTER — Ambulatory Visit (HOSPITAL_COMMUNITY): Payer: Medicare Other

## 2017-09-13 ENCOUNTER — Ambulatory Visit (HOSPITAL_COMMUNITY): Payer: Medicare Other

## 2017-09-15 ENCOUNTER — Ambulatory Visit (HOSPITAL_COMMUNITY): Payer: Medicare Other

## 2017-09-17 ENCOUNTER — Telehealth (HOSPITAL_COMMUNITY): Payer: Self-pay

## 2017-09-17 ENCOUNTER — Ambulatory Visit (HOSPITAL_COMMUNITY): Payer: Medicare Other

## 2017-09-17 NOTE — Telephone Encounter (Signed)
Called to see if patient was interested in rescheduling orientation - Patient stated he is not interested. Closed referral.

## 2017-09-22 ENCOUNTER — Ambulatory Visit (HOSPITAL_COMMUNITY): Payer: Medicare Other

## 2017-09-24 ENCOUNTER — Ambulatory Visit (HOSPITAL_COMMUNITY): Payer: Medicare Other

## 2017-09-27 ENCOUNTER — Ambulatory Visit (HOSPITAL_COMMUNITY): Payer: Medicare Other

## 2017-09-29 ENCOUNTER — Ambulatory Visit (HOSPITAL_COMMUNITY): Payer: Medicare Other

## 2017-10-01 ENCOUNTER — Ambulatory Visit (HOSPITAL_COMMUNITY): Payer: Medicare Other

## 2017-10-04 ENCOUNTER — Ambulatory Visit (HOSPITAL_COMMUNITY): Payer: Medicare Other

## 2017-10-06 ENCOUNTER — Ambulatory Visit (HOSPITAL_COMMUNITY): Payer: Medicare Other

## 2017-10-08 ENCOUNTER — Ambulatory Visit (HOSPITAL_COMMUNITY): Payer: Medicare Other

## 2017-10-11 ENCOUNTER — Ambulatory Visit (HOSPITAL_COMMUNITY): Payer: Medicare Other

## 2017-10-13 ENCOUNTER — Ambulatory Visit (HOSPITAL_COMMUNITY): Payer: Medicare Other

## 2017-10-15 ENCOUNTER — Ambulatory Visit (HOSPITAL_COMMUNITY): Payer: Medicare Other

## 2017-10-18 ENCOUNTER — Ambulatory Visit (HOSPITAL_COMMUNITY): Payer: Medicare Other

## 2017-10-20 ENCOUNTER — Ambulatory Visit (HOSPITAL_COMMUNITY): Payer: Medicare Other

## 2017-10-22 ENCOUNTER — Ambulatory Visit (HOSPITAL_COMMUNITY): Payer: Medicare Other

## 2017-10-25 ENCOUNTER — Ambulatory Visit (HOSPITAL_COMMUNITY): Payer: Medicare Other

## 2017-10-27 ENCOUNTER — Ambulatory Visit (HOSPITAL_COMMUNITY): Payer: Medicare Other

## 2017-10-29 ENCOUNTER — Ambulatory Visit (HOSPITAL_COMMUNITY): Payer: Medicare Other

## 2017-11-01 ENCOUNTER — Ambulatory Visit (HOSPITAL_COMMUNITY): Payer: Medicare Other

## 2017-11-03 ENCOUNTER — Ambulatory Visit (HOSPITAL_COMMUNITY): Payer: Medicare Other

## 2017-11-05 ENCOUNTER — Ambulatory Visit (HOSPITAL_COMMUNITY): Payer: Medicare Other

## 2017-11-08 ENCOUNTER — Ambulatory Visit (HOSPITAL_COMMUNITY): Payer: Medicare Other

## 2017-11-10 ENCOUNTER — Ambulatory Visit (HOSPITAL_COMMUNITY): Payer: Medicare Other

## 2017-11-12 ENCOUNTER — Ambulatory Visit (HOSPITAL_COMMUNITY): Payer: Medicare Other

## 2018-06-08 ENCOUNTER — Emergency Department (HOSPITAL_COMMUNITY)
Admission: EM | Admit: 2018-06-08 | Discharge: 2018-06-08 | Disposition: A | Discharge status: Home / Self Care | Payer: Medicare Other | Attending: Emergency Medicine | Admitting: Emergency Medicine

## 2018-06-08 ENCOUNTER — Emergency Department (HOSPITAL_COMMUNITY): Payer: Medicare Other

## 2018-06-08 ENCOUNTER — Other Ambulatory Visit: Payer: Self-pay

## 2018-06-08 ENCOUNTER — Encounter (HOSPITAL_COMMUNITY): Payer: Self-pay | Admitting: Emergency Medicine

## 2018-06-08 DIAGNOSIS — Z955 Presence of coronary angioplasty implant and graft: Secondary | ICD-10-CM | POA: Insufficient documentation

## 2018-06-08 DIAGNOSIS — R109 Unspecified abdominal pain: Secondary | ICD-10-CM | POA: Diagnosis present

## 2018-06-08 DIAGNOSIS — I10 Essential (primary) hypertension: Secondary | ICD-10-CM | POA: Insufficient documentation

## 2018-06-08 DIAGNOSIS — Z87891 Personal history of nicotine dependence: Secondary | ICD-10-CM | POA: Insufficient documentation

## 2018-06-08 DIAGNOSIS — K5900 Constipation, unspecified: Secondary | ICD-10-CM | POA: Insufficient documentation

## 2018-06-08 LAB — CBC WITH DIFFERENTIAL/PLATELET
ABS IMMATURE GRANULOCYTES: 0 10*3/uL (ref 0.0–0.1)
Basophils Absolute: 0.1 10*3/uL (ref 0.0–0.1)
Basophils Relative: 1 %
EOS PCT: 1 %
Eosinophils Absolute: 0.1 10*3/uL (ref 0.0–0.7)
HCT: 44.4 % (ref 39.0–52.0)
HEMOGLOBIN: 15.7 g/dL (ref 13.0–17.0)
Immature Granulocytes: 0 %
LYMPHS PCT: 33 %
Lymphs Abs: 2.1 10*3/uL (ref 0.7–4.0)
MCH: 33.5 pg (ref 26.0–34.0)
MCHC: 35.4 g/dL (ref 30.0–36.0)
MCV: 94.7 fL (ref 78.0–100.0)
MONO ABS: 0.6 10*3/uL (ref 0.1–1.0)
MONOS PCT: 9 %
NEUTROS ABS: 3.6 10*3/uL (ref 1.7–7.7)
Neutrophils Relative %: 56 %
Platelets: 248 10*3/uL (ref 150–400)
RBC: 4.69 MIL/uL (ref 4.22–5.81)
RDW: 12.9 % (ref 11.5–15.5)
WBC: 6.5 10*3/uL (ref 4.0–10.5)

## 2018-06-08 LAB — URINALYSIS, ROUTINE W REFLEX MICROSCOPIC
Bilirubin Urine: NEGATIVE
GLUCOSE, UA: 50 mg/dL — AB
Ketones, ur: NEGATIVE mg/dL
NITRITE: NEGATIVE
PH: 5 (ref 5.0–8.0)
Protein, ur: NEGATIVE mg/dL
SPECIFIC GRAVITY, URINE: 1.016 (ref 1.005–1.030)

## 2018-06-08 LAB — COMPREHENSIVE METABOLIC PANEL
ALT: 54 U/L — ABNORMAL HIGH (ref 0–44)
ANION GAP: 12 (ref 5–15)
AST: 49 U/L — AB (ref 15–41)
Albumin: 3.9 g/dL (ref 3.5–5.0)
Alkaline Phosphatase: 69 U/L (ref 38–126)
BILIRUBIN TOTAL: 1.7 mg/dL — AB (ref 0.3–1.2)
BUN: 11 mg/dL (ref 8–23)
CO2: 23 mmol/L (ref 22–32)
Calcium: 9.4 mg/dL (ref 8.9–10.3)
Chloride: 100 mmol/L (ref 98–111)
Creatinine, Ser: 0.96 mg/dL (ref 0.61–1.24)
GFR calc Af Amer: >60 mL/min (ref >60)
GLUCOSE: 168 mg/dL — AB (ref 70–99)
POTASSIUM: 3.6 mmol/L (ref 3.5–5.1)
Sodium: 135 mmol/L (ref 135–145)
TOTAL PROTEIN: 8 g/dL (ref 6.5–8.1)

## 2018-06-08 MED ORDER — SENNOSIDES-DOCUSATE SODIUM 8.6-50 MG PO TABS: 1.0000 | ORAL_TABLET | Freq: Every evening | ORAL | 0 refills | Status: DC | PRN

## 2018-06-08 MED ORDER — SODIUM CHLORIDE 0.9 % IV BOLUS
500.0000 mL | Freq: Once | INTRAVENOUS | Status: AC
  Administered 2018-06-08: 500 mL via INTRAVENOUS

## 2018-06-08 MED ORDER — DICYCLOMINE HCL 20 MG PO TABS: 20.0000 mg | ORAL_TABLET | Freq: Two times a day (BID) | ORAL | 0 refills | Status: DC | PRN

## 2018-06-08 MED ORDER — KETOROLAC TROMETHAMINE 30 MG/ML IJ SOLN
15.0000 mg | Freq: Once | INTRAMUSCULAR | Status: AC
  Administered 2018-06-08: 15 mg via INTRAVENOUS
  Filled 2018-06-08: qty 1

## 2018-06-08 NOTE — Discharge Instructions (Signed)

## 2018-06-08 NOTE — ED Triage Notes (Signed)
Bilateral side pain for 2 weeks. Hx of constipation. Been taking miralax but has not taken for 2 days. Denies N,V,D. Last BM 2 days ago. Hx kidney stones.

## 2018-06-08 NOTE — ED Provider Notes (Signed)
Emergency Department Provider Note   I have reviewed the triage vital signs and the nursing notes.   HISTORY  Chief Complaint Flank Pain   HPI Phillip Wong is a 72 y.o. male with PMH of HTN, HLD, and chronic low back pain resents to the emergency department for evaluation of bilateral abdominal pain with constipation history.  The patient has had 2 weeks of pain.  He has been taking MiraLAX for abdominal discomfort over the past 2 days and passing small amounts of stool.  No blood in the bowel movements.  He is having no nausea, vomiting, diarrhea.  No fevers or chills.  No chest pain or shortness of breath.  He does have a known history of kidney stones is following with urology.  He is having some dysuria and began treatment with Macrobid yesterday through his PCP.  States his symptoms feel like gas. No radiation or modifying factors.   Past Medical History:  Diagnosis Date  . Arthritis   . At risk for sleep apnea    STOP-BANG= 4        SENT TO PCP 11-15-2014  . Chronic constipation   . Chronic low back pain   . First degree heart block   . Hydronephrosis, right   . Hyperlipidemia   . Hypertension   . Hyperthyroidism   . Nephrolithiasis    bilateral -- right is obstructive  . Nocturia   . Right ureteral stone   . Rosacea   . Simple renal cyst    left    Patient Active Problem List   Diagnosis Date Noted  . Coronary arteriosclerosis after percutaneous transluminal coronary angioplasty (PTCA) 07/13/2017  . Angina pectoris (HCC) 07/11/2017  . Claudication in peripheral vascular disease (HCC) 12/14/2016  . Umbilical hernia-small supraumbilical 09/30/2012    Past Surgical History:  Procedure Laterality Date  . ABDOMINAL AORTOGRAM W/LOWER EXTREMITY N/A 12/15/2016   Procedure: Abdominal Aortogram w/Lower Extremity;  Surgeon: Yates Decamp, MD;  Location: [facility] INVASIVE CV LAB;  Service: Cardiovascular;  Laterality: N/A;  . CORONARY STENT INTERVENTION N/A 07/13/2017   Procedure: CORONARY STENT INTERVENTION;  Surgeon: Yates Decamp, MD;  Location: [facility] INVASIVE CV LAB;  Service: Cardiovascular;  Laterality: N/A;  . CYSTOSCOPY WITH RETROGRADE PYELOGRAM, URETEROSCOPY AND STENT PLACEMENT Right 03/12/2015   Procedure: CYSTO/RIGHT RETROGRADE PYELOGRAM/URETEROSCOPY/STONE EXTRACTION WITH BASKET AND RIGHT STENT PLACEMENT.;  Surgeon: Jerilee Field, MD;  Location: [facility];  Service: Urology;  Laterality: Right;  . CYSTOSCOPY WITH STENT PLACEMENT Right 11/20/2014   Procedure: CYSTOSCOPY WITH RIGHT RETROGRADE PYLEGRAM AND RIGHT URETERAL STENT PLACEMENT;  Surgeon: Jerilee Field, MD;  Location: [facility];  Service: Urology;  Laterality: Right;  . CYSTOSCOPY WITH URETEROSCOPY, STONE BASKETRY AND STENT PLACEMENT Right 12/18/2014   Procedure: CYSTOSCOPY WITH URETEROSCOPY, STONE BASKETRY AND STENT PLACEMENT;  Surgeon: Jerilee Field, MD;  Location: [facility];  Service: Urology;  Laterality: Right;  . HOLMIUM LASER APPLICATION Right 03/12/2015   Procedure: HOLMIUM LASER APPLICATION;  Surgeon: Jerilee Field, MD;  Location: [facility];  Service: Urology;  Laterality: Right;  . LEFT HEART CATH AND CORONARY ANGIOGRAPHY N/A 07/13/2017   Procedure: LEFT HEART CATH AND CORONARY ANGIOGRAPHY;  Surgeon: Yates Decamp, MD;  Location: [facility] INVASIVE CV LAB;  Service: Cardiovascular;  Laterality: N/A;  . PERIPHERAL VASCULAR INTERVENTION  12/15/2016   Procedure: Peripheral Vascular Intervention;  Surgeon: Yates Decamp, MD;  Location: [facility] INVASIVE CV LAB;  Service: Cardiovascular;;  RCIA  . VIDEO ASSISTED THORACOSCOPY (VATS)/DECORTICATION  2013   pneumonia    Allergies Patient has no known allergies.  Family History  Problem Relation Age of Onset  . Heart disease Mother     Social History Social History   Tobacco Use  . Smoking status: Former Smoker    Packs/day: 2.00    Years: 40.00    Pack years: 80.00    Types: Cigarettes    Last attempt  to quit: 09/28/1998    Years since quitting: 19.7  . Smokeless tobacco: Current User    Types: Snuff  Substance Use Topics  . Alcohol use: Yes    Alcohol/week: 4.0 standard drinks    Types: 4 Cans of beer per week  . Drug use: No    Review of Systems  Constitutional: No fever/chills Eyes: No visual changes. ENT: No sore throat. Cardiovascular: Denies chest pain. Respiratory: Denies shortness of breath. Gastrointestinal: Positive abdominal pain.  No nausea, no vomiting.  No diarrhea.  No constipation. Genitourinary: Negative for dysuria. Musculoskeletal: Negative for back pain. Skin: Negative for rash. Neurological: Negative for headaches, focal weakness or numbness.  10-point ROS otherwise negative.  ____________________________________________   PHYSICAL EXAM:  VITAL SIGNS: ED Triage Vitals  Enc Vitals Group     BP 06/08/18 1101 124/77     Pulse Rate 06/08/18 1101 75     Resp 06/08/18 1101 16     Temp 06/08/18 1101 98.2 F (36.8 C)     Temp Source 06/08/18 1101 Oral     SpO2 06/08/18 1101 98 %     Weight 06/08/18 1210 200 lb (90.7 kg)     Height 06/08/18 1210 5\' 10"  (1.778 m)     Pain Score 06/08/18 1041 6   Constitutional: Alert and oriented. Well appearing and in no acute distress. Eyes: Conjunctivae are normal. Head: Atraumatic. Nose: No congestion/rhinnorhea. Mouth/Throat: Mucous membranes are moist.  Neck: No stridor. Cardiovascular: Normal rate, regular rhythm. Good peripheral circulation. Grossly normal heart sounds.   Respiratory: Normal respiratory effort.  No retractions. Lungs CTAB. Gastrointestinal: Soft with mild diffuse tenderness. No distention.  Musculoskeletal: No lower extremity tenderness nor edema. No gross deformities of extremities. Neurologic:  Normal speech and language. No gross focal neurologic deficits are appreciated.  Skin:  Skin is warm, dry and intact. No rash noted.  ____________________________________________   LABS (all  labs ordered are listed, but only abnormal results are displayed)  Labs Reviewed  URINALYSIS, ROUTINE W REFLEX MICROSCOPIC - Abnormal; Notable for the following components:      Result Value   APPearance HAZY (*)    Glucose, UA 50 (*)    Hgb urine dipstick MODERATE (*)    Leukocytes, UA SMALL (*)    Bacteria, UA RARE (*)    All other components within normal limits  COMPREHENSIVE METABOLIC PANEL - Abnormal; Notable for the following components:   Glucose, Bld 168 (*)    AST 49 (*)    ALT 54 (*)    Total Bilirubin 1.7 (*)    All other components within normal limits  CBC WITH DIFFERENTIAL/PLATELET   ____________________________________________  RADIOLOGY  Dg Abdomen 1 View  Result Date: 06/08/2018 CLINICAL DATA:  Constipation EXAM: ABDOMEN - 1 VIEW COMPARISON:  04/22/2018 FINDINGS: Calcifications within the left kidney. 2.1 cm staghorn appearing stone. Nonobstructive bowel gas pattern. No significant increase in stool burden. No free air or organomegaly. No acute bony abnormality. Vascular stent noted in the region of the right common iliac artery. IMPRESSION: Left nephrolithiasis. Electronically Signed  By: Charlett Nose M.D.   On: 06/08/2018 11:25   Ct Renal Stone Study  Result Date: 06/08/2018 CLINICAL DATA:  Bilateral flank pain for 2 weeks, history of renal stones. EXAM: CT ABDOMEN AND PELVIS WITHOUT CONTRAST TECHNIQUE: Multidetector CT imaging of the abdomen and pelvis was performed following the standard protocol without IV contrast. COMPARISON:  06/21/2014. FINDINGS: Lower chest: Lung bases show no acute findings. Heart size normal. Coronary artery calcification. No pericardial or pleural effusion. Hepatobiliary: Liver is slightly decreased in attenuation diffusely and the margin may be subtly irregular. Liver measures 21.9 cm. Gallbladder is unremarkable. No biliary ductal dilatation. Pancreas: Negative. Spleen: Negative. Adrenals/Urinary Tract: Adrenal glands and right kidney  are unremarkable. Stones in the left kidney measure up to 1.6 cm. Ureters are decompressed. Bladder is low in volume. Stomach/Bowel: Stomach, small bowel, appendix and colon are unremarkable. Vascular/Lymphatic: Atherosclerotic calcification of the arterial vasculature without abdominal aortic aneurysm. No pathologically enlarged lymph nodes. Reproductive: Prostate is visualized. Other: Small bilateral inguinal hernias contain fat. No free fluid. Mesenteries and peritoneum are unremarkable. Musculoskeletal: Degenerative changes in the spine. IMPRESSION: 1. Left renal stones.  No obstruction. 2. Aortic atherosclerosis (ICD10-170.0). Coronary artery calcification. 3. Enlarged steatotic liver. Suspect subtle marginal irregularity, raising suspicion for cirrhosis. Electronically Signed   By: Leanna Battles M.D.   On: 06/08/2018 13:24    ____________________________________________   PROCEDURES  Procedure(s) performed:   Procedures  None ____________________________________________   INITIAL IMPRESSION / ASSESSMENT AND PLAN / ED COURSE  Pertinent labs & imaging results that were available during my care of the patient were reviewed by me and considered in my medical decision making (see chart for details).  Presents to the emergency department with mild, diffuse abdominal pain now hurting slightly worse on the left but mostly bilateral.  Some associated constipation.  Plain film labs reviewed from triage with no acute findings other than known left renal calculus.  CT renal followed this with no obstruction resulting from renal stone.  She does have trace findings on UA to suggest possible UTI but he started Macrobid yesterday.  Plan to continue this.  We will discharge the patient home with symptom management including Bentyl and additional medication for constipation.  At this time, I do not feel there is any life-threatening condition present. I have reviewed and discussed all results (EKG,  imaging, lab, urine as appropriate), exam findings with patient. I have reviewed nursing notes and appropriate previous records.  I feel the patient is safe to be discharged home without further emergent workup. Discussed usual and customary return precautions. Patient and family (if present) verbalize understanding and are comfortable with this plan.  Patient will follow-up with their primary care provider. If they do not have a primary care provider, information for follow-up has been provided to them. All questions have been answered.  ____________________________________________  FINAL CLINICAL IMPRESSION(S) / ED DIAGNOSES  Final diagnoses:  Left flank pain  Constipation, unspecified constipation type     MEDICATIONS GIVEN DURING THIS VISIT:  Medications  sodium chloride 0.9 % bolus 500 mL (500 mLs Intravenous New Bag/Given 06/08/18 1320)  ketorolac (TORADOL) 30 MG/ML injection 15 mg (15 mg Intravenous Given 06/08/18 1320)     NEW OUTPATIENT MEDICATIONS STARTED DURING THIS VISIT:  New Prescriptions   DICYCLOMINE (BENTYL) 20 MG TABLET    Take 1 tablet (20 mg total) by mouth 2 (two) times daily as needed for spasms (abdominal cramping).   SENNA-DOCUSATE (SENOKOT-S) 8.6-50 MG TABLET    Take  1 tablet by mouth at bedtime as needed for mild constipation or moderate constipation.    Note:  This document was prepared using Dragon voice recognition software and may include unintentional dictation errors.  Alona Bene, MD Emergency Medicine    Deshaun Schou, Arlyss Repress, MD 06/08/18 1357

## 2018-06-08 NOTE — ED Notes (Signed)
ED Provider at bedside for re-evaluation 

## 2018-06-08 NOTE — ED Notes (Signed)
Patient transported to CT 

## 2018-06-17 ENCOUNTER — Other Ambulatory Visit: Payer: Self-pay

## 2018-06-17 ENCOUNTER — Encounter (HOSPITAL_COMMUNITY): Payer: Self-pay

## 2018-06-17 ENCOUNTER — Emergency Department (HOSPITAL_COMMUNITY): Payer: Medicare Other

## 2018-06-17 ENCOUNTER — Emergency Department (HOSPITAL_COMMUNITY)
Admission: EM | Admit: 2018-06-17 | Discharge: 2018-06-17 | Disposition: A | Discharge status: Home / Self Care | Payer: Medicare Other | Attending: Emergency Medicine | Admitting: Emergency Medicine

## 2018-06-17 DIAGNOSIS — Z7982 Long term (current) use of aspirin: Secondary | ICD-10-CM | POA: Diagnosis not present

## 2018-06-17 DIAGNOSIS — Z87891 Personal history of nicotine dependence: Secondary | ICD-10-CM | POA: Diagnosis not present

## 2018-06-17 DIAGNOSIS — K7469 Other cirrhosis of liver: Secondary | ICD-10-CM

## 2018-06-17 DIAGNOSIS — Z79899 Other long term (current) drug therapy: Secondary | ICD-10-CM | POA: Insufficient documentation

## 2018-06-17 DIAGNOSIS — R1013 Epigastric pain: Secondary | ICD-10-CM | POA: Diagnosis not present

## 2018-06-17 DIAGNOSIS — I10 Essential (primary) hypertension: Secondary | ICD-10-CM | POA: Insufficient documentation

## 2018-06-17 LAB — COMPREHENSIVE METABOLIC PANEL
ALK PHOS: 75 U/L (ref 38–126)
ALT: 52 U/L — ABNORMAL HIGH (ref 0–44)
ANION GAP: 14 (ref 5–15)
AST: 48 U/L — ABNORMAL HIGH (ref 15–41)
Albumin: 4.1 g/dL (ref 3.5–5.0)
BILIRUBIN TOTAL: 1.7 mg/dL — AB (ref 0.3–1.2)
BUN: 12 mg/dL (ref 8–23)
CO2: 24 mmol/L (ref 22–32)
Calcium: 9.9 mg/dL (ref 8.9–10.3)
Chloride: 97 mmol/L — ABNORMAL LOW (ref 98–111)
Creatinine, Ser: 0.84 mg/dL (ref 0.61–1.24)
GLUCOSE: 167 mg/dL — AB (ref 70–99)
POTASSIUM: 3.3 mmol/L — AB (ref 3.5–5.1)
Sodium: 135 mmol/L (ref 135–145)
TOTAL PROTEIN: 8 g/dL (ref 6.5–8.1)

## 2018-06-17 LAB — URINALYSIS, ROUTINE W REFLEX MICROSCOPIC
BILIRUBIN URINE: NEGATIVE
Bacteria, UA: NONE SEEN
GLUCOSE, UA: NEGATIVE mg/dL
Ketones, ur: NEGATIVE mg/dL
LEUKOCYTES UA: NEGATIVE
NITRITE: NEGATIVE
PROTEIN: NEGATIVE mg/dL
Specific Gravity, Urine: 1.017 (ref 1.005–1.030)
pH: 5 (ref 5.0–8.0)

## 2018-06-17 LAB — CBC
HEMATOCRIT: 47.1 % (ref 39.0–52.0)
Hemoglobin: 16.4 g/dL (ref 13.0–17.0)
MCH: 33.1 pg (ref 26.0–34.0)
MCHC: 34.8 g/dL (ref 30.0–36.0)
MCV: 95 fL (ref 78.0–100.0)
Platelets: 276 10*3/uL (ref 150–400)
RBC: 4.96 MIL/uL (ref 4.22–5.81)
RDW: 12.9 % (ref 11.5–15.5)
WBC: 7.2 10*3/uL (ref 4.0–10.5)

## 2018-06-17 LAB — LIPASE, BLOOD: Lipase: 45 U/L (ref 11–51)

## 2018-06-17 LAB — ETHANOL

## 2018-06-17 LAB — I-STAT TROPONIN, ED: Troponin i, poc: 0 ng/mL (ref 0.00–0.08)

## 2018-06-17 LAB — ACETAMINOPHEN LEVEL: Acetaminophen (Tylenol), Serum: <10 ug/mL — ABNORMAL LOW (ref 10–30)

## 2018-06-17 MED ORDER — ESOMEPRAZOLE MAGNESIUM 20 MG PO CPDR: 20.0000 mg | DELAYED_RELEASE_CAPSULE | Freq: Every day | ORAL | 0 refills | Status: DC

## 2018-06-17 MED ORDER — SODIUM CHLORIDE 0.9 % IV BOLUS
1000.0000 mL | Freq: Once | INTRAVENOUS | Status: AC
  Administered 2018-06-17: 1000 mL via INTRAVENOUS

## 2018-06-17 MED ORDER — FAMOTIDINE 20 MG PO TABS: 20.0000 mg | ORAL_TABLET | Freq: Two times a day (BID) | ORAL | 0 refills | Status: DC

## 2018-06-17 MED ORDER — ONDANSETRON HCL 4 MG/2ML IJ SOLN
4.0000 mg | Freq: Once | INTRAMUSCULAR | Status: AC
  Administered 2018-06-17: 4 mg via INTRAVENOUS
  Filled 2018-06-17: qty 2

## 2018-06-17 MED ORDER — FAMOTIDINE IN NACL 20-0.9 MG/50ML-% IV SOLN
20.0000 mg | Freq: Once | INTRAVENOUS | Status: AC
  Administered 2018-06-17: 20 mg via INTRAVENOUS
  Filled 2018-06-17: qty 50

## 2018-06-17 MED ORDER — IOHEXOL 300 MG/ML  SOLN
100.0000 mL | Freq: Once | INTRAMUSCULAR | Status: AC | PRN
  Administered 2018-06-17: 100 mL via INTRAVENOUS

## 2018-06-17 NOTE — ED Notes (Signed)
Patient verbalizes understanding of discharge instructions. Opportunity for questioning and answers were provided. Armband removed by staff, pt discharged from ED, ambulatory to the lobby with family.  

## 2018-06-17 NOTE — Discharge Instructions (Signed)
Take nexium 20 mg daily.   Take pepcid 20 mg twice daily.   Continue your other meds.   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 Stop drinking alcohol   See your doctor in a week.   See GI doctor for follow up   Return to ER if you have worse chest pain, abdominal pain, vomiting, fever.

## 2018-06-17 NOTE — ED Triage Notes (Signed)
Pt states that he is having pain in his RLQ, was seen here last week and told to return if pain did not resolve. Denis [place], last BM this morning "normal with a whole lot of gas"

## 2018-06-17 NOTE — ED Notes (Signed)
Pt ambulatory to the bathroom with steady gait.

## 2018-06-17 NOTE — ED Notes (Signed)
Pt c/o chest and abd pain for a week he describes the pain as a cramp in his rt lower ribs bi-laterally  He was seen here for the same one week ago.  Alert oriented skin war and dry  Pain worse whenever he moves .  Family at the bedside

## 2018-06-17 NOTE — ED Notes (Signed)
Pt returned from ct

## 2018-06-17 NOTE — ED Provider Notes (Signed)
[facility] EMERGENCY DEPARTMENT Provider Note   CSN: [id number] Arrival date & time: 06/17/18  1400     History   Chief Complaint Chief Complaint  Patient presents with  . Abdominal Pain    HPI RHYAN RADLER is a 72 y.o. male history of alcohol use, previous kidney stones, previous MI here presenting with abdominal pain.  Patient has been having epigastric pain is worse after eating.  Recently radiated to the left upper quadrant.  Patient was seen in the ED about a week ago for similar symptoms and had a CT renal stone study that showed nonobstructing renal stone with no hydro as well as some signs of cirrhosis.  Patient has been having persistent abdominal pain.  He also continues to drink beers every day.  Denies any fevers or chills or vomiting.  The history is provided by the patient.    Past Medical History:  Diagnosis Date  . Arthritis   . At risk for sleep apnea    STOP-BANG= 4        SENT TO PCP 11-15-2014  . Chronic constipation   . Chronic low back pain   . First degree heart block   . Hydronephrosis, right   . Hyperlipidemia   . Hypertension   . Hyperthyroidism   . Nephrolithiasis    bilateral -- right is obstructive  . Nocturia   . Right ureteral stone   . Rosacea   . Simple renal cyst    left    Patient Active Problem List   Diagnosis Date Noted  . Coronary arteriosclerosis after percutaneous transluminal coronary angioplasty (PTCA) 07/13/2017  . Angina pectoris (HCC) 07/11/2017  . Claudication in peripheral vascular disease (HCC) 12/14/2016  . Umbilical hernia-small supraumbilical 09/30/2012    Past Surgical History:  Procedure Laterality Date  . ABDOMINAL AORTOGRAM W/LOWER EXTREMITY N/A 12/15/2016   Procedure: Abdominal Aortogram w/Lower Extremity;  Surgeon: Yates Decamp, MD;  Location: [facility] INVASIVE CV LAB;  Service: Cardiovascular;  Laterality: N/A;  . CORONARY STENT INTERVENTION N/A 07/13/2017   Procedure: CORONARY STENT  INTERVENTION;  Surgeon: Yates Decamp, MD;  Location: [facility] INVASIVE CV LAB;  Service: Cardiovascular;  Laterality: N/A;  . CYSTOSCOPY WITH RETROGRADE PYELOGRAM, URETEROSCOPY AND STENT PLACEMENT Right 03/12/2015   Procedure: CYSTO/RIGHT RETROGRADE PYELOGRAM/URETEROSCOPY/STONE EXTRACTION WITH BASKET AND RIGHT STENT PLACEMENT.;  Surgeon: Jerilee Field, MD;  Location: [facility];  Service: Urology;  Laterality: Right;  . CYSTOSCOPY WITH STENT PLACEMENT Right 11/20/2014   Procedure: CYSTOSCOPY WITH RIGHT RETROGRADE PYLEGRAM AND RIGHT URETERAL STENT PLACEMENT;  Surgeon: Jerilee Field, MD;  Location: [facility];  Service: Urology;  Laterality: Right;  . CYSTOSCOPY WITH URETEROSCOPY, STONE BASKETRY AND STENT PLACEMENT Right 12/18/2014   Procedure: CYSTOSCOPY WITH URETEROSCOPY, STONE BASKETRY AND STENT PLACEMENT;  Surgeon: Jerilee Field, MD;  Location: [facility];  Service: Urology;  Laterality: Right;  . HOLMIUM LASER APPLICATION Right 03/12/2015   Procedure: HOLMIUM LASER APPLICATION;  Surgeon: Jerilee Field, MD;  Location: [facility];  Service: Urology;  Laterality: Right;  . LEFT HEART CATH AND CORONARY ANGIOGRAPHY N/A 07/13/2017   Procedure: LEFT HEART CATH AND CORONARY ANGIOGRAPHY;  Surgeon: Yates Decamp, MD;  Location: [facility] INVASIVE CV LAB;  Service: Cardiovascular;  Laterality: N/A;  . PERIPHERAL VASCULAR INTERVENTION  12/15/2016   Procedure: Peripheral Vascular Intervention;  Surgeon: Yates Decamp, MD;  Location: [facility] INVASIVE CV LAB;  Service: Cardiovascular;;  RCIA  . VIDEO ASSISTED THORACOSCOPY (VATS)/DECORTICATION  2013   pneumonia  Home Medications    Prior to Admission medications   Medication Sig Start Date End Date Taking? Authorizing Provider  allopurinol (ZYLOPRIM) 300 MG tablet Take 300 mg by mouth every morning.    Yes [provider]  ALPRAZolam Prudy Feeler) 0.5 MG tablet Take 0.5 mg by mouth 2 (two) times daily. 03/15/18  Yes  [provider]  amitriptyline (ELAVIL) 10 MG tablet Take 10 mg by mouth at bedtime.  11/20/14  Yes [provider]  aspirin EC 81 MG tablet Take 1 tablet (81 mg total) by mouth daily. 12/15/16  Yes Yates Decamp, MD  azithromycin (ZITHROMAX) 250 MG tablet Take 250 mg by mouth See admin instructions. Take 250 mg by mouth twice weekly on Monday and Thursday   Yes [provider]  carvedilol (COREG) 3.125 MG tablet Take 3.125 mg by mouth 2 (two) times daily with a meal.  08/29/12  Yes [provider]  diclofenac sodium (VOLTAREN) 1 % GEL Apply 2 g topically daily as needed (for leg pain).    Yes [provider]  dicyclomine (BENTYL) 20 MG tablet Take 1 tablet (20 mg total) by mouth 2 (two) times daily as needed for spasms (abdominal cramping). 06/08/18  Yes Long, Arlyss Repress, MD  finasteride (PROSCAR) 5 MG tablet Take 5 mg by mouth daily after breakfast.  09/15/12  Yes [provider]  hydrochlorothiazide (HYDRODIURIL) 25 MG tablet Take 25 mg by mouth every morning.  09/26/12  Yes [provider]  HYDROcodone-acetaminophen (NORCO) 7.5-325 MG per tablet Take 1 tablet by mouth 4 (four) times daily.  08/18/12  Yes [provider]  nitroGLYCERIN (NITROSTAT) 0.4 MG SL tablet Place 0.4 mg under the tongue every 5 (five) minutes as needed for chest pain.  11/06/16  Yes [provider]  polyethylene glycol (MIRALAX / GLYCOLAX) packet Take 17 g by mouth daily as needed for mild constipation or moderate constipation.    Yes [provider]  pravastatin (PRAVACHOL) 40 MG tablet Take 40 mg by mouth every morning.  08/29/12  Yes [provider]  propylthiouracil (PTU) 50 MG tablet Take 50 mg by mouth 2 (two) times daily.  09/15/12  Yes [provider]  tamsulosin (FLOMAX) 0.4 MG CAPS capsule Take 0.4 mg by mouth at bedtime.    Yes [provider]  ticagrelor (BRILINTA) 90 MG TABS tablet Take 1 tablet (90 mg total)  by mouth 2 (two) times daily. 07/14/17  Yes Yates Decamp, MD  Vitamin D, Ergocalciferol, (DRISDOL) 50000 units CAPS capsule Take 50,000 Units by mouth every Tuesday.   Yes [provider]  senna-docusate (SENOKOT-S) 8.6-50 MG tablet Take 1 tablet by mouth at bedtime as needed for mild constipation or moderate constipation. Patient not taking: Reported on 06/17/2018 06/08/18   Long, Arlyss Repress, MD    Family History Family History  Problem Relation Age of Onset  . Heart disease Mother     Social History Social History   Tobacco Use  . Smoking status: Former Smoker    Packs/day: 2.00    Years: 40.00    Pack years: 80.00    Types: Cigarettes    Last attempt to quit: 09/28/1998    Years since quitting: 19.7  . Smokeless tobacco: Current User    Types: Snuff  Substance Use Topics  . Alcohol use: Yes    Alcohol/week: 4.0 standard drinks    Types: 4 Cans of beer per week  . Drug use: No     Allergies  Patient has no known allergies.   Review of Systems Review of Systems  Gastrointestinal: Positive for abdominal pain.  All other systems reviewed and are negative.    Physical Exam Updated Vital Signs BP (!) 144/70   Pulse 73   Temp (!) 97.5 F (36.4 C) (Oral)   Resp 14   Ht 5\' 10"  (1.778 m)   Wt 90.7 kg   SpO2 95%   BMI 28.70 kg/m   Physical Exam  Constitutional: He is oriented to person, place, and time. He appears well-developed and well-nourished.  HENT:  Head: Normocephalic.  MM slightly dry   Eyes: Pupils are equal, round, and reactive to light. EOM are normal.  Cardiovascular: Normal rate, regular rhythm and normal heart sounds.  Pulmonary/Chest: Effort normal and breath sounds normal.  Abdominal: Normal appearance.  + distended, ? Fluid wave, mild epigastric and LUQ tenderness   Neurological: He is alert and oriented to person, place, and time.  Skin: Skin is warm. Capillary refill takes less than 2 seconds.  Psychiatric: He has a normal mood and  affect. His behavior is normal.  Nursing note and vitals reviewed.    ED Treatments / Results  Labs (all labs ordered are listed, but only abnormal results are displayed) Labs Reviewed  COMPREHENSIVE METABOLIC PANEL - Abnormal; Notable for the following components:      Result Value   Potassium 3.3 (*)    Chloride 97 (*)    Glucose, Bld 167 (*)    AST 48 (*)    ALT 52 (*)    Total Bilirubin 1.7 (*)    All other components within normal limits  URINALYSIS, ROUTINE W REFLEX MICROSCOPIC - Abnormal; Notable for the following components:   Hgb urine dipstick MODERATE (*)    All other components within normal limits  ACETAMINOPHEN LEVEL - Abnormal; Notable for the following components:   Acetaminophen (Tylenol), Serum <10 (*)    All other components within normal limits  LIPASE, BLOOD  CBC  ETHANOL  I-STAT TROPONIN, ED    EKG None  Radiology Ct Abdomen Pelvis W Contrast  Result Date: 06/17/2018 CLINICAL DATA:  RIGHT lower quadrant pain persists. EXAM: CT ABDOMEN AND PELVIS WITH CONTRAST TECHNIQUE: Multidetector CT imaging of the abdomen and pelvis was performed using the standard protocol following bolus administration of intravenous contrast. CONTRAST:  100mL OMNIPAQUE IOHEXOL 300 MG/ML  SOLN COMPARISON:  CT abdomen and pelvis 06/08/2018. FINDINGS: Lower chest: No acute lung findings. Coronary artery calcification. Normal heart size. Hepatobiliary: Diffuse decrease in liver attenuation as noted previously. No focal masses. Overall span of the liver is increased, 22 cm. Gallbladder unremarkable. No biliary ductal dilatation. Pancreas: Negative. Spleen: Negative. Adrenals/Urinary Tract: Adrenal glands and RIGHT kidney are unremarkable. Stones in the LEFT kidney are redemonstrated, up to 1.6 cm. No signs of bladder obstruction although the prostate does project into its base. Stomach/Bowel: Stomach, small bowel, appendix and colon are normal. Vascular/Lymphatic: Atherosclerosis.  No  abdominal aortic aneurysm. Reproductive: Prostate is enlarged. Other: Small BILATERAL inguinal canal hernias contain fat. No free fluid. Musculoskeletal: Lumbar degenerative change. IMPRESSION: Stable appearance CT abdomen and pelvis from examination slightly greater than 1 week ago. Normal appendix. No acute intra-abdominal findings. Atherosclerosis. Hepatomegaly. Coronary artery calcifications. LEFT nephrolithiasis. Electronically Signed   By: Elsie StainJohn T Curnes M.D.   On: 06/17/2018 19:08    Procedures Procedures (including critical care time)  Medications Ordered in ED Medications  sodium chloride 0.9 % bolus 1,000 mL (0 mLs Intravenous Stopped 06/17/18 2000)  ondansetron [facility]) injection 4 mg (4 mg Intravenous Given 06/17/18 1825)  famotidine (PEPCID) IVPB 20 mg premix (0 mg Intravenous Stopped 06/17/18 1911)  iohexol (OMNIPAQUE) 300 MG/ML solution 100 mL (100 mLs Intravenous Contrast Given 06/17/18 1843)     Initial Impression / Assessment and Plan / ED Course  I have reviewed the triage vital signs and the nursing notes.  Pertinent labs & imaging results that were available during my care of the patient were reviewed by me and considered in my medical decision making (see chart for details).    LENO MATHES is a 72 y.o. male here with abdominal pain, distention. Worse after eating. I considered alcohol gastritis vs worsening cirrhosis vs gastric ulcer. Patient doesn't have a GI doctor currently. Will get labs, get CT ab/pel (recent CT was noncontrast for renal stone so will get CT with contrast for cirrhosis and better evaluation of the liver), UA. Will give IVF, pepcid, zofran and reassess.   8:38 PM Felt better with zofran, pepcid. ETOH neg, LFTs stable. CT showed no increased cirrhosis. I suspect alcohol gastritis vs gastric ulcer. Will start on PPI, pepcid. Told him to stop drinking alcohol. Will have him follow up with GI outpatient.   Final Clinical Impressions(s) / ED Diagnoses    Final diagnoses:  None    ED Discharge Orders    None       Charlynne Pander, MD 06/17/18 2039

## 2018-06-17 NOTE — ED Notes (Signed)
To ct

## 2018-07-24 ENCOUNTER — Emergency Department (HOSPITAL_COMMUNITY): Payer: Medicare Other

## 2018-07-24 ENCOUNTER — Encounter (HOSPITAL_COMMUNITY): Payer: Self-pay | Admitting: Emergency Medicine

## 2018-07-24 ENCOUNTER — Other Ambulatory Visit: Payer: Self-pay

## 2018-07-24 ENCOUNTER — Observation Stay (HOSPITAL_COMMUNITY)
Admission: EM | Admit: 2018-07-24 | Discharge: 2018-07-29 | Disposition: A | Discharge status: Skilled Nursing Facility | Payer: Medicare Other | Attending: Student in an Organized Health Care Education/Training Program | Admitting: Student in an Organized Health Care Education/Training Program

## 2018-07-24 ENCOUNTER — Observation Stay (HOSPITAL_COMMUNITY): Payer: Medicare Other

## 2018-07-24 DIAGNOSIS — E0590 Thyrotoxicosis, unspecified without thyrotoxic crisis or storm: Secondary | ICD-10-CM | POA: Diagnosis not present

## 2018-07-24 DIAGNOSIS — R2681 Unsteadiness on feet: Secondary | ICD-10-CM | POA: Diagnosis not present

## 2018-07-24 DIAGNOSIS — Z955 Presence of coronary angioplasty implant and graft: Secondary | ICD-10-CM | POA: Insufficient documentation

## 2018-07-24 DIAGNOSIS — I10 Essential (primary) hypertension: Secondary | ICD-10-CM | POA: Diagnosis not present

## 2018-07-24 DIAGNOSIS — W19XXXA Unspecified fall, initial encounter: Secondary | ICD-10-CM | POA: Insufficient documentation

## 2018-07-24 DIAGNOSIS — E876 Hypokalemia: Secondary | ICD-10-CM | POA: Insufficient documentation

## 2018-07-24 DIAGNOSIS — I2510 Atherosclerotic heart disease of native coronary artery without angina pectoris: Secondary | ICD-10-CM | POA: Insufficient documentation

## 2018-07-24 DIAGNOSIS — M48061 Spinal stenosis, lumbar region without neurogenic claudication: Secondary | ICD-10-CM | POA: Diagnosis not present

## 2018-07-24 DIAGNOSIS — R55 Syncope and collapse: Secondary | ICD-10-CM | POA: Diagnosis present

## 2018-07-24 DIAGNOSIS — Z79899 Other long term (current) drug therapy: Secondary | ICD-10-CM | POA: Diagnosis not present

## 2018-07-24 DIAGNOSIS — Y92009 Unspecified place in unspecified non-institutional (private) residence as the place of occurrence of the external cause: Secondary | ICD-10-CM | POA: Insufficient documentation

## 2018-07-24 DIAGNOSIS — E785 Hyperlipidemia, unspecified: Secondary | ICD-10-CM | POA: Insufficient documentation

## 2018-07-24 DIAGNOSIS — Z7982 Long term (current) use of aspirin: Secondary | ICD-10-CM | POA: Diagnosis not present

## 2018-07-24 DIAGNOSIS — Z87891 Personal history of nicotine dependence: Secondary | ICD-10-CM | POA: Insufficient documentation

## 2018-07-24 DIAGNOSIS — S32001A Stable burst fracture of unspecified lumbar vertebra, initial encounter for closed fracture: Secondary | ICD-10-CM

## 2018-07-24 DIAGNOSIS — S32011A Stable burst fracture of first lumbar vertebra, initial encounter for closed fracture: Secondary | ICD-10-CM | POA: Diagnosis not present

## 2018-07-24 LAB — CBC WITH DIFFERENTIAL/PLATELET
ABS IMMATURE GRANULOCYTES: 0.06 10*3/uL (ref 0.00–0.07)
BASOS PCT: 0 %
Basophils Absolute: 0 10*3/uL (ref 0.0–0.1)
EOS ABS: 0 10*3/uL (ref 0.0–0.5)
Eosinophils Relative: 0 %
HCT: 44.1 % (ref 39.0–52.0)
Hemoglobin: 16 g/dL (ref 13.0–17.0)
IMMATURE GRANULOCYTES: 0 %
Lymphocytes Relative: 10 %
Lymphs Abs: 1.3 10*3/uL (ref 0.7–4.0)
MCH: 32.7 pg (ref 26.0–34.0)
MCHC: 36.3 g/dL — ABNORMAL HIGH (ref 30.0–36.0)
MCV: 90 fL (ref 80.0–100.0)
MONO ABS: 0.9 10*3/uL (ref 0.1–1.0)
MONOS PCT: 6 %
NEUTROS ABS: 11.2 10*3/uL — AB (ref 1.7–7.7)
NEUTROS PCT: 84 %
Platelets: 240 10*3/uL (ref 150–400)
RBC: 4.9 MIL/uL (ref 4.22–5.81)
RDW: 12.6 % (ref 11.5–15.5)
WBC: 13.5 10*3/uL — AB (ref 4.0–10.5)
nRBC: 0 % (ref 0.0–0.2)

## 2018-07-24 LAB — I-STAT TROPONIN, ED: Troponin i, poc: 0.01 ng/mL (ref 0.00–0.08)

## 2018-07-24 LAB — COMPREHENSIVE METABOLIC PANEL
ALT: 52 U/L — AB (ref 0–44)
AST: 43 U/L — ABNORMAL HIGH (ref 15–41)
Albumin: 3.9 g/dL (ref 3.5–5.0)
Alkaline Phosphatase: 65 U/L (ref 38–126)
Anion gap: 12 (ref 5–15)
BUN: 8 mg/dL (ref 8–23)
CO2: 25 mmol/L (ref 22–32)
Calcium: 9.5 mg/dL (ref 8.9–10.3)
Chloride: 95 mmol/L — ABNORMAL LOW (ref 98–111)
Creatinine, Ser: 0.78 mg/dL (ref 0.61–1.24)
Glucose, Bld: 159 mg/dL — ABNORMAL HIGH (ref 70–99)
POTASSIUM: 2.6 mmol/L — AB (ref 3.5–5.1)
SODIUM: 132 mmol/L — AB (ref 135–145)
Total Bilirubin: 2 mg/dL — ABNORMAL HIGH (ref 0.3–1.2)
Total Protein: 7.3 g/dL (ref 6.5–8.1)

## 2018-07-24 LAB — AMMONIA: Ammonia: 14 umol/L (ref 9–35)

## 2018-07-24 LAB — MAGNESIUM: Magnesium: 2.1 mg/dL (ref 1.7–2.4)

## 2018-07-24 LAB — URINALYSIS, ROUTINE W REFLEX MICROSCOPIC
BACTERIA UA: NONE SEEN
Bilirubin Urine: NEGATIVE
GLUCOSE, UA: NEGATIVE mg/dL
Ketones, ur: NEGATIVE mg/dL
LEUKOCYTES UA: NEGATIVE
Nitrite: NEGATIVE
PH: 7 (ref 5.0–8.0)
Protein, ur: NEGATIVE mg/dL
SPECIFIC GRAVITY, URINE: 1.014 (ref 1.005–1.030)

## 2018-07-24 LAB — HEMOGLOBIN A1C
HEMOGLOBIN A1C: 6.1 % — AB (ref 4.8–5.6)
MEAN PLASMA GLUCOSE: 128.37 mg/dL

## 2018-07-24 LAB — ETHANOL: Alcohol, Ethyl (B): <10 mg/dL

## 2018-07-24 MED ORDER — ENOXAPARIN SODIUM 40 MG/0.4ML ~~LOC~~ SOLN
40.0000 mg | SUBCUTANEOUS | Status: DC
  Administered 2018-07-24 – 2018-07-28 (×5): 40 mg via SUBCUTANEOUS
  Filled 2018-07-24 (×5): qty 0.4

## 2018-07-24 MED ORDER — KETOROLAC TROMETHAMINE 15 MG/ML IJ SOLN
15.0000 mg | Freq: Once | INTRAMUSCULAR | Status: AC
  Administered 2018-07-24: 15 mg via INTRAVENOUS
  Filled 2018-07-24: qty 1

## 2018-07-24 MED ORDER — VITAMIN B-1 100 MG PO TABS
100.0000 mg | ORAL_TABLET | Freq: Every day | ORAL | Status: DC
  Administered 2018-07-24 – 2018-07-29 (×6): 100 mg via ORAL
  Filled 2018-07-24 (×6): qty 1

## 2018-07-24 MED ORDER — POLYETHYLENE GLYCOL 3350 17 G PO PACK
17.0000 g | PACK | Freq: Every day | ORAL | Status: DC
  Administered 2018-07-24 – 2018-07-28 (×4): 17 g via ORAL
  Filled 2018-07-24 (×6): qty 1

## 2018-07-24 MED ORDER — AZITHROMYCIN 500 MG PO TABS
250.0000 mg | ORAL_TABLET | ORAL | Status: DC
  Administered 2018-07-25 – 2018-07-28 (×2): 250 mg via ORAL
  Filled 2018-07-24 (×2): qty 1

## 2018-07-24 MED ORDER — AMITRIPTYLINE HCL 10 MG PO TABS
10.0000 mg | ORAL_TABLET | Freq: Every day | ORAL | Status: DC
  Administered 2018-07-24 – 2018-07-28 (×5): 10 mg via ORAL
  Filled 2018-07-24 (×6): qty 1

## 2018-07-24 MED ORDER — OXYCODONE-ACETAMINOPHEN 5-325 MG PO TABS
1.0000 | ORAL_TABLET | Freq: Four times a day (QID) | ORAL | Status: DC | PRN
  Administered 2018-07-25 – 2018-07-26 (×6): 1 via ORAL
  Filled 2018-07-24 (×6): qty 1

## 2018-07-24 MED ORDER — OXYCODONE HCL 5 MG PO TABS
5.0000 mg | ORAL_TABLET | ORAL | Status: DC | PRN
  Administered 2018-07-25 – 2018-07-26 (×2): 5 mg via ORAL
  Filled 2018-07-24 (×2): qty 1

## 2018-07-24 MED ORDER — TAMSULOSIN HCL 0.4 MG PO CAPS
0.4000 mg | ORAL_CAPSULE | Freq: Every day | ORAL | Status: DC
  Administered 2018-07-24 – 2018-07-28 (×5): 0.4 mg via ORAL
  Filled 2018-07-24 (×5): qty 1

## 2018-07-24 MED ORDER — HYDROMORPHONE HCL 1 MG/ML IJ SOLN
1.0000 mg | Freq: Once | INTRAMUSCULAR | Status: AC
  Administered 2018-07-24: 1 mg via INTRAVENOUS
  Filled 2018-07-24: qty 1

## 2018-07-24 MED ORDER — FOLIC ACID 1 MG PO TABS
1.0000 mg | ORAL_TABLET | Freq: Every day | ORAL | Status: DC
  Administered 2018-07-24 – 2018-07-29 (×6): 1 mg via ORAL
  Filled 2018-07-24 (×6): qty 1

## 2018-07-24 MED ORDER — TICAGRELOR 90 MG PO TABS
90.0000 mg | ORAL_TABLET | Freq: Two times a day (BID) | ORAL | Status: DC
  Administered 2018-07-25 – 2018-07-29 (×9): 90 mg via ORAL
  Filled 2018-07-24 (×10): qty 1

## 2018-07-24 MED ORDER — POTASSIUM CHLORIDE CRYS ER 20 MEQ PO TBCR
40.0000 meq | EXTENDED_RELEASE_TABLET | Freq: Once | ORAL | Status: AC
  Administered 2018-07-24: 40 meq via ORAL
  Filled 2018-07-24: qty 2

## 2018-07-24 MED ORDER — PRAVASTATIN SODIUM 40 MG PO TABS
40.0000 mg | ORAL_TABLET | Freq: Every morning | ORAL | Status: DC
  Administered 2018-07-25 – 2018-07-29 (×5): 40 mg via ORAL
  Filled 2018-07-24 (×5): qty 1

## 2018-07-24 MED ORDER — POTASSIUM CHLORIDE 10 MEQ/100ML IV SOLN
10.0000 meq | INTRAVENOUS | Status: AC
  Administered 2018-07-24 (×3): 10 meq via INTRAVENOUS
  Filled 2018-07-24 (×3): qty 100

## 2018-07-24 MED ORDER — DIAZEPAM 5 MG/ML IJ SOLN
5.0000 mg | Freq: Once | INTRAMUSCULAR | Status: AC
  Administered 2018-07-24: 5 mg via INTRAVENOUS
  Filled 2018-07-24: qty 2

## 2018-07-24 MED ORDER — FINASTERIDE 5 MG PO TABS
5.0000 mg | ORAL_TABLET | Freq: Every day | ORAL | Status: DC
  Administered 2018-07-25 – 2018-07-29 (×5): 5 mg via ORAL
  Filled 2018-07-24 (×6): qty 1

## 2018-07-24 MED ORDER — PROPYLTHIOURACIL 50 MG PO TABS
50.0000 mg | ORAL_TABLET | Freq: Two times a day (BID) | ORAL | Status: DC
  Administered 2018-07-24 – 2018-07-29 (×10): 50 mg via ORAL
  Filled 2018-07-24 (×11): qty 1

## 2018-07-24 MED ORDER — PANTOPRAZOLE SODIUM 40 MG PO TBEC
40.0000 mg | DELAYED_RELEASE_TABLET | Freq: Every day | ORAL | Status: DC
  Administered 2018-07-25 – 2018-07-29 (×5): 40 mg via ORAL
  Filled 2018-07-24 (×5): qty 1

## 2018-07-24 MED ORDER — ASPIRIN EC 81 MG PO TBEC
81.0000 mg | DELAYED_RELEASE_TABLET | Freq: Every day | ORAL | Status: DC
  Administered 2018-07-25 – 2018-07-29 (×5): 81 mg via ORAL
  Filled 2018-07-24 (×5): qty 1

## 2018-07-24 MED ORDER — SENNA 8.6 MG PO TABS
1.0000 | ORAL_TABLET | Freq: Two times a day (BID) | ORAL | Status: DC
  Administered 2018-07-24 – 2018-07-28 (×8): 8.6 mg via ORAL
  Filled 2018-07-24 (×8): qty 1

## 2018-07-24 MED ORDER — CARVEDILOL 3.125 MG PO TABS
3.1250 mg | ORAL_TABLET | Freq: Two times a day (BID) | ORAL | Status: DC
  Administered 2018-07-25 – 2018-07-29 (×10): 3.125 mg via ORAL
  Filled 2018-07-24 (×11): qty 1

## 2018-07-24 MED ORDER — SODIUM CHLORIDE 0.9 % IV BOLUS
1000.0000 mL | Freq: Once | INTRAVENOUS | Status: AC
  Administered 2018-07-24: 1000 mL via INTRAVENOUS

## 2018-07-24 MED ORDER — LACTULOSE 10 GM/15ML PO SOLN
20.0000 g | Freq: Every day | ORAL | Status: DC | PRN
  Administered 2018-07-25: 20 g via ORAL
  Filled 2018-07-24: qty 1200

## 2018-07-24 MED ORDER — ALLOPURINOL 300 MG PO TABS
300.0000 mg | ORAL_TABLET | Freq: Every morning | ORAL | Status: DC
  Administered 2018-07-25 – 2018-07-29 (×5): 300 mg via ORAL
  Filled 2018-07-24 (×6): qty 1

## 2018-07-24 NOTE — H&P (Signed)
Date: 07/24/2018               Patient Name:  Phillip Wong MRN: [id number]  DOB: 05-18-1946 Age / Sex: 72 y.o., male   PCP: Tamsen Roers, MD         Medical Service: Internal Medicine Teaching Service         Attending Physician: Dr. Aldine Contes, MD    First Contact: Dr. Lonia Skinner Pager: [phone]  Second Contact: Dr. Kalman Shan Pager: [phone]       After Hours (After 5p/  First Contact Pager: [phone]  weekends / holidays): Second Contact Pager: [phone]   Chief Complaint: Syncope  History of Present Illness:  Phillip Wong is a 72 yo M w/ PMH of CAD s/p DUE stent x1 in left circumflex in 2018, HTN, HLD, chronic low back pain, and hx of EtOH abuse presenting with syncopal episode and fall. He was examined and evaluated at bedside this AM. He was observed screaming out and writhing in pain in intermittent episodes. He states he was in his usual state of health until this afternoon when shortly after taking his carvedilol, he stood up from bed to grab his lactulose when he suddenly fell face down. He states the episode occurred very suddenly and he is unsure if he lost consciousness or he simply fell. He does not remember any symptoms prior to falling and he denies any prior history of syncope or seizures. He states after he fell, he was unable to get up due to significant hip pain which he believe he may have hurt on his way down and it took him 20 minutes to crawl to a phone and call 911. He states the pain is like a sharp stabbing knife in his right hip radiating from abdomen to the back. He states the pain has been worse since the fall and it is exacerbated by movement.  On review of systems, he denies any associated headache, blurry vision, urinary/bowel incontinence, dyspnea, chest pain, palpitations, light-headedness or dizziness, numbness, tingling, or weakness. Denies any fever, chills, nausea, vomiting, diarrhea. He states he was told that he has low potassium  which may have contributed to his fall and he believes he may have low potassium because he has not been eating much recently. He states during his last ED visit, he was told he had liver injury and he had been trying to stick to a low sodium diet but it has been difficult due to availability of low sodium drugs. He denies any additional doses of his pain medications or benzos and he states he takes them as prescribed. He also states he takes carvedilol 6.11m+3.125mg in the morning and 3.1235min the afternoon despite that specific dose not being mentioned on his med list. He also denies any dysuria, urgency or frequency but he has been on nitrofurantoin for UTI for 2 months. He states when he stops taking his nitro, his urinary symptoms will return.   In the ED, he was found to have potassium of 2.6 and he was started on 3 runs of IV KCl and given K-dur 4041m He was also given 1 L NS and Dilaudid & toradol for pain control. X-ray of the hip was negative but CT of the lumbar spine showed incomplete burst fracture of L1 as well as left nephrolithiasis. Neurosurgery was consulted for management of lumbar fracture. IM was consulted for admission.  Meds:  Current Meds  Medication Sig  . aspirin EC 81 MG  tablet Take 1 tablet (81 mg total) by mouth daily.   Allergies: Allergies as of 07/24/2018  . (No Known Allergies)   Past Medical History:  Diagnosis Date  . Arthritis   . At risk for sleep apnea    STOP-BANG= 4        SENT TO PCP 11-15-2014  . Chronic constipation   . Chronic low back pain   . First degree heart block   . Hydronephrosis, right   . Hyperlipidemia   . Hypertension   . Hyperthyroidism   . Nephrolithiasis    bilateral -- right is obstructive  . Nocturia   . Right ureteral stone   . Rosacea   . Simple renal cyst    left    Family History:  Family History  Problem Relation Age of Onset  . Heart disease Mother    Brother also had heart disease in his 71s  Social  History: He lives with his son. 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 45 pack year smoking history now quit for last 4 years; h/o EtOH use about 4 beers per day but quit last month. Denies illicit drug use.  Review of Systems: A complete ROS was negative except as per HPI.   Physical Exam: Blood pressure (!) 118/54, pulse 73, temperature 98.2 F (36.8 C), temperature source Oral, resp. rate 15, height _0  (1.727 m), weight 86.2 kg, SpO2 94 %. Physical Exam  Constitutional: He appears distressed (screming out intermittently in pain).  HENT:  Head: Normocephalic and atraumatic.  Mouth/Throat: No oropharyngeal exudate.  Dry mucous membranes  Eyes: Pupils are equal, round, and reactive to light. Conjunctivae and EOM are normal. No scleral icterus.  Neck: Normal range of motion. Neck supple.  Cardiovascular: Normal rate, regular rhythm and intact distal pulses.  Distant heart sounds  Pulmonary/Chest: Effort normal and breath sounds normal. No respiratory distress. He has no rales.  Abdominal: Soft. Bowel sounds are normal. He exhibits no distension. There is no tenderness.  Musculoskeletal: He exhibits tenderness (tenderness of right hip on movement. Passive ROM intact. Active ROM, especially R hip flexion limited by pain.). He exhibits no edema or deformity.  Neurological:  Neurologic exam: Mental status: A&Ox3 Cranial Nerves: II: PERRL III, IV, VI: Extra-occular motions intact bilaterally V, VII: Face symmetric, sensation intact in all 3 divisions  VIII: hearing normal to rubbing fingers bilaterally  IX, X: palate rises symmetrically XI: Head turn and shoulder shrug normal bilaterally  XII: tongue midline  Motor: Strength 5/5 on all upper and lower extremities, bulk muscle and tone are normal. Deep Tendon Reflexes: 2+ symmetric Gait: Unable to perform 2/2 pain Sensory: Light  touch intact and symmetric bilaterally  Coordination: There is no dysmetria on finger-to-nose. Rapid alternating movement test normal. Heel to shin test normal.  Skin: Skin is warm and dry. He is not diaphoretic.  Psychiatric: Mood, memory, affect and judgment normal.   EKG: personally reviewed my interpretation is normal sinus, ?left axis deviation, T wave inversions on  Anterior/septal and lateral leads.   CXR: N/A  Assessment & Plan by Problem: Phillip Wong is a 72yo M w/ PMH of CAD s/p DUE stent x1 in left circumflex in 2018, Nephrolithiasis s/p stents, HTN, HLD, chronic low back pain, and hx of EtOH abuse presenting fall and back pain after questionable syncope. Patient was found to have L1 compression fracture and to be hypokalemic in the ED which may have been caused by poor oral intake or lactulose induced  diarrhea. He is also endorsing questionable dosing of his anti-hypertensive which is concerning for medication induced hypotensive episode. However he denies actual syncope and he may have simply fallen after losing his balance. Neurosurg is on board. Internal medicine will admit for syncope work up and hypokalemia and non-operative management of his lumbar fracture.  ?Syncope 2/2 arrhythmia vs orthostatic hypotension Questionable episode of syncope per history. K 2.6 on admit. EKG normal sinus. BP on admit 118/54. Per pt, taking 9.375 of carvedilol BID and fall occurred shortly after his carvedilol dose. Also taking lactulose BID for constipation and tamsulosin for BPH. Pharmacy tech mentions she will re-clarify home dosing in AM as his reported medication regimen is very different from his known prescribed meds. No significant findings on neuro exam. Low suspicion of seizure or stroke. - F/u with pharmacy about outpatient med regimen - Telemetry - Unable to get orthostats 2/2 pain - Holding home HCTZ and amlodipine - Decrease home carvedilol to 3.128m BID (states he was taking 9.3749m in AM and 3.12554mn PM)   Hypokalemia 2/2 hctz vs laxative use vs poor oral intake On Lactulose 10g/52m69mD and HCTZ 25mg66mly at home. Endorsing recent poor oral intake per history. Not on any insulin. Admit K 2.6 - Received K-dur PO 40mEq28mCl IV 10mEqx19m the ED - Trend BMP - Replete as appropriate  Lower back pain 2/2 lumbar compression fracture 2/2 fall L1 burst fracture on CT lumbar spine. Neurosurgery on board. - Appreciate neurosurg recs - Percocet 5-325mg q662mRN for moderate to severe pain - Oxycodone IR 5mg q4hr40mr breakthrough - PT/OT eval after seen by neurosurg  H/o CAD s/p 2 prox cx stents (10/18): No chest pain recently or preceding the event. He states he has been consistent with taking his medicines.  - C/w home meds: asa 81mg dail44micagrelor 90mg BID, 80mastatin 40mg daily,58meg 3.125mg BID  Tr67mminitis 2/2 chronic alcohol use vs NASH History of EtOH use and obesity. States he has been trying to stick to low sodium diet but has had poor oral intake as he has limited options available to him. Denies any alcohol use since September. CT abd in 06/17/18 show hepatomegaly w/o ascites or obvious cirrhotic changes. Liver enzymes w/o significant changes: On admit AST 43, ALT 52, Alk phos 65, TB 2.0 - Check hepatitis viral panel and HIV to r/o other causes of elevated liver enzymes  Hyperthyroidism - C/w home meds: PTU 50mg BID  Hx 30mout - C/w home meds: allopurinol 300mg daily  GE71m pantoprazole 40mg daily  Hx 48mPH - C/w home meds: tamsulosin 0.4mg qhs  DVT pro90m: Lovenox Diet: Cardiac diet Bowel: Lactulose Code: Full  Dispo: Admit patient to Observation with expected length of stay less than 2 midnights.  Signed: Braylinn Gulden K, MDMosetta Anis8:58 PM  Pager: [phone]

## 2018-07-24 NOTE — ED Notes (Signed)
Patient transported to X-ray 

## 2018-07-24 NOTE — Consult Note (Signed)
Reason for Consult:L1 fracture Referring Physician: Dr. Virgina Organ [facility] Wong is an 72 y.o. male.  HPI: Patient is a 72 yo male with an apparent syncopal episode resulting in a fall from standing height. Patient sustained an L1 compression fracture. He is admitted for further care and syncope workup. Denies lower extremity weakness but has substantial back pain.  Past Medical History:  Diagnosis Date  . Arthritis   . At risk for sleep apnea    STOP-BANG= 4        SENT TO PCP 11-15-2014  . Chronic constipation   . Chronic low back pain   . First degree heart block   . Hydronephrosis, right   . Hyperlipidemia   . Hypertension   . Hyperthyroidism   . Nephrolithiasis    bilateral -- right is obstructive  . Nocturia   . Right ureteral stone   . Rosacea   . Simple renal cyst    left    Past Surgical History:  Procedure Laterality Date  . ABDOMINAL AORTOGRAM W/LOWER EXTREMITY N/A 12/15/2016   Procedure: Abdominal Aortogram w/Lower Extremity;  Surgeon: Adrian Prows, MD;  Location: [place];  Service: Cardiovascular;  Laterality: N/A;  . CORONARY STENT INTERVENTION N/A 07/13/2017   Procedure: CORONARY STENT INTERVENTION;  Surgeon: Adrian Prows, MD;  Location: [place] [facility];  Service: Cardiovascular;  Laterality: N/A;  . CYSTOSCOPY WITH RETROGRADE PYELOGRAM, URETEROSCOPY AND STENT PLACEMENT Right 03/12/2015   Procedure: CYSTO/RIGHT RETROGRADE PYELOGRAM/URETEROSCOPY/STONE EXTRACTION WITH BASKET AND RIGHT STENT PLACEMENT.;  Surgeon: Festus Aloe, MD;  Location: [facility];  Service: Urology;  Laterality: Right;  . CYSTOSCOPY WITH STENT PLACEMENT Right 11/20/2014   Procedure: CYSTOSCOPY WITH RIGHT RETROGRADE PYLEGRAM AND RIGHT URETERAL STENT PLACEMENT;  Surgeon: Festus Aloe, MD;  Location: [facility];  Service: Urology;  Laterality: Right;  . CYSTOSCOPY WITH URETEROSCOPY, STONE BASKETRY AND STENT PLACEMENT Right 12/18/2014   Procedure: CYSTOSCOPY  WITH URETEROSCOPY, STONE BASKETRY AND STENT PLACEMENT;  Surgeon: Festus Aloe, MD;  Location: [facility];  Service: Urology;  Laterality: Right;  . HOLMIUM LASER APPLICATION Right 2/58/5277   Procedure: HOLMIUM LASER APPLICATION;  Surgeon: Festus Aloe, MD;  Location: [facility];  Service: Urology;  Laterality: Right;  . LEFT HEART CATH AND CORONARY ANGIOGRAPHY N/A 07/13/2017   Procedure: LEFT HEART CATH AND CORONARY ANGIOGRAPHY;  Surgeon: Adrian Prows, MD;  Location: [place] [facility];  Service: Cardiovascular;  Laterality: N/A;  . PERIPHERAL VASCULAR INTERVENTION  12/15/2016   Procedure: Peripheral Vascular Intervention;  Surgeon: Adrian Prows, MD;  Location: [place] [facility];  Service: Cardiovascular;;  RCIA  . VIDEO ASSISTED THORACOSCOPY (VATS)/DECORTICATION  2013   pneumonia    Family History  Problem Relation Age of Onset  . Heart disease Mother     Social History:  reports that he quit smoking about 19 years ago. His smoking use included cigarettes. He has a 80.00 pack-year smoking history. His smokeless tobacco use includes snuff. He reports that he drinks about 4.0 standard drinks of alcohol per week. He reports that he does not use drugs.  Allergies: No Known Allergies  Medications: I have reviewed the patient's current medications.  Results for orders placed or performed during the hospital encounter of 07/24/18 (from the past 48 hour(s))  CBC with Differential/Platelet     Status: Abnormal   Collection Time: 07/24/18  3:47 PM  Result Value Ref Range   WBC 13.5 (H) 4.0 - 10.5 K/uL   RBC  4.90 4.22 - 5.81 MIL/uL   Hemoglobin 16.0 13.0 - 17.0 g/dL   HCT 44.1 39.0 - 52.0 %   MCV 90.0 80.0 - 100.0 fL   MCH 32.7 26.0 - 34.0 pg   MCHC 36.3 (H) 30.0 - 36.0 g/dL   RDW 12.6 11.5 - 15.5 %   Platelets 240 150 - 400 K/uL   nRBC 0.0 0.0 - 0.2 %   Neutrophils Relative % 84 %   Neutro Abs 11.2 (H) 1.7 - 7.7 K/uL   Lymphocytes Relative 10 %   Lymphs Abs 1.3 0.7 - 4.0  K/uL   Monocytes Relative 6 %   Monocytes Absolute 0.9 0.1 - 1.0 K/uL   Eosinophils Relative 0 %   Eosinophils Absolute 0.0 0.0 - 0.5 K/uL   Basophils Relative 0 %   Basophils Absolute 0.0 0.0 - 0.1 K/uL   Immature Granulocytes 0 %   Abs Immature Granulocytes 0.06 0.00 - 0.07 K/uL    Comment: Performed at [facility] [address], [place], [place] [address]  Comprehensive metabolic panel     Status: Abnormal   Collection Time: 07/24/18  3:47 PM  Result Value Ref Range   Sodium 132 (L) 135 - 145 mmol/L   Potassium 2.6 (LL) 3.5 - 5.1 mmol/L    Comment: CRITICAL RESULT CALLED TO, READ BACK BY AND VERIFIED WITH: K.COBB,RN @ 1653 07/24/2018 [id number]    Chloride 95 (L) 98 - 111 mmol/L   CO2 25 22 - 32 mmol/L   Glucose, Bld 159 (H) 70 - 99 mg/dL   BUN 8 8 - 23 mg/dL   Creatinine, Ser 0.78 0.61 - 1.24 mg/dL   Calcium 9.5 8.9 - 10.3 mg/dL   Total Protein 7.3 6.5 - 8.1 g/dL   Albumin 3.9 3.5 - 5.0 g/dL   AST 43 (H) 15 - 41 U/L   ALT 52 (H) 0 - 44 U/L   Alkaline Phosphatase 65 38 - 126 U/L   Total Bilirubin 2.0 (H) 0.3 - 1.2 mg/dL   GFR calc non Af Amer >60 >60 mL/min   GFR calc Af Amer >60 >60 mL/min    Comment: (NOTE) The eGFR has been calculated using the CKD EPI equation. This calculation has not been validated in all clinical situations. eGFR's persistently <60 mL/min signify possible Chronic Kidney Disease.    Anion gap 12 5 - 15    Comment: Performed at [facility] [address] [place], [place] [address]  Ethanol     Status: None   Collection Time: 07/24/18  3:47 PM  Result Value Ref Range   Alcohol, Ethyl (B) <10 <10 mg/dL    Comment: (NOTE) Lowest detectable limit for serum alcohol is 10 mg/dL. For medical purposes only. Performed at [facility], [place] [address] [place], [place] [address]   Hemoglobin A1c     Status: Abnormal   Collection Time: 07/24/18  3:47 PM  Result Value Ref Range   Hgb A1c MFr Bld 6.1 (H) 4.8 - 5.6 %    Comment:  (NOTE) Pre diabetes:          5.7%-6.4% Diabetes:              >6.4% Glycemic control for   <7.0% adults with diabetes    Mean Plasma Glucose 128.37 mg/dL    Comment: Performed at [facility] [address] [place], [place] [address]  I-stat troponin, ED     Status: None  Collection Time: 07/24/18  4:00 PM  Result Value Ref Range   Troponin i, poc 0.01 0.00 - 0.08 ng/mL   Comment 3            Comment: Due to the release kinetics of cTnI, a negative result within the first hours of the onset of symptoms does not rule out myocardial infarction with certainty. If myocardial infarction is still suspected, repeat the test at appropriate intervals.   Ammonia     Status: None   Collection Time: 07/24/18  4:47 PM  Result Value Ref Range   Ammonia 14 9 - 35 umol/L    Comment: Performed at [facility], [place] [address] [place], [place] [address]  Urinalysis, Routine w reflex microscopic     Status: Abnormal   Collection Time: 07/24/18  4:51 PM  Result Value Ref Range   Color, Urine YELLOW YELLOW   APPearance CLEAR CLEAR   Specific Gravity, Urine 1.014 1.005 - 1.030   pH 7.0 5.0 - 8.0   Glucose, UA NEGATIVE NEGATIVE mg/dL   Hgb urine dipstick LARGE (A) NEGATIVE   Bilirubin Urine NEGATIVE NEGATIVE   Ketones, ur NEGATIVE NEGATIVE mg/dL   Protein, ur NEGATIVE NEGATIVE mg/dL   Nitrite NEGATIVE NEGATIVE   Leukocytes, UA NEGATIVE NEGATIVE   RBC / HPF >50 (H) 0 - 5 RBC/hpf   WBC, UA 11-20 0 - 5 WBC/hpf   Bacteria, UA NONE SEEN NONE SEEN   Mucus PRESENT    Ca Oxalate Crys, UA PRESENT     Comment: Performed at [facility] [address] [place], [place] [address]    Ct Lumbar Spine Wo Contrast  Result Date: 07/24/2018 CLINICAL DATA:  Syncope with fall.  Back pain. EXAM: CT LUMBAR SPINE WITHOUT CONTRAST TECHNIQUE: Multidetector CT imaging of the lumbar spine was performed without intravenous contrast administration. Multiplanar CT image reconstructions were also  generated. COMPARISON:  CT abdomen pelvis 06/17/2018 FINDINGS: Segmentation: 5 lumbar type vertebrae. Alignment: Normal. Vertebrae: There is an incomplete burst fracture of L1 with less than 25% height loss and no retropulsion. The fracture involves the superior endplate, anterior and posterior walls. No other fracture or focal osseous lesion. Paraspinal and other soft tissues: Calcific aortic atherosclerosis. Right common iliac artery stent. Multiple left renal calculi, measuring up to 16 mm. Disc levels: No spinal canal stenosis. Mild right L4-5 neural foraminal stenosis. IMPRESSION: 1. Incomplete burst fracture of L1 with approximately 10% height loss and no retropulsion. The fracture involves the superior endplate and the anterior and posterior walls. Alignment is normal. 2. Left nephrolithiasis measuring up to 16 mm without hydronephrosis. Aortic Atherosclerosis (ICD10-I70.0). Electronically Signed   By: Ulyses Jarred M.D.   On: 07/24/2018 16:54   Dg Hip Unilat W Or Wo Pelvis 2-3 Views Right  Result Date: 07/24/2018 CLINICAL DATA:  Pain after fall EXAM: DG HIP (WITH OR WITHOUT PELVIS) 2-3V RIGHT COMPARISON:  None. FINDINGS: There is no evidence of hip fracture or dislocation. There is no evidence of arthropathy or other focal bone abnormality. IMPRESSION: Negative. Electronically Signed   By: Dorise Bullion III M.D   On: 07/24/2018 17:35    Review of Systems  Constitutional: Negative.   HENT: Negative.   Eyes: Negative.   Respiratory: Negative.   Cardiovascular: Negative.   Gastrointestinal: Negative.   Genitourinary: Negative.   Musculoskeletal: Negative.   Skin: Negative.   Neurological: Negative.   Endo/Heme/Allergies: Negative.   Psychiatric/Behavioral: Negative.    Blood pressure (!) 118/54,  pulse 73, temperature 98.2 F (36.8 C), temperature source Oral, resp. rate 15, height '5\' 8"'  (1.727 m), weight 86.2 kg, SpO2 94 %. Physical Exam  Constitutional: He is oriented to person,  place, and time. He appears well-developed and well-nourished.  HENT:  Head: Normocephalic and atraumatic.  Eyes: Pupils are equal, round, and reactive to light. Conjunctivae and EOM are normal.  Neck: Normal range of motion. Neck supple.  GI: Soft. Bowel sounds are normal.  Musculoskeletal:  Substantial back pain difficult to turn onto either side tender to light palpation.  Neurological: He is alert and oriented to person, place, and time.  Good motor function in both lower extremities ambulatory status not tested.  Deep tendon reflexes are trace in the patellae absent in the Achilles.  Radial nerve examination upper extremity strength is normal  Skin:  Significant abrasions about both knees from fall.  Psychiatric: He has a normal mood and affect. His behavior is normal. Judgment and thought content normal.    Assessment/Plan: L1 compression fracture. Plan. Treat conservatively with mobilization using a TLSO  Earleen Newport 07/24/2018, 8:55 PM

## 2018-07-24 NOTE — ED Triage Notes (Signed)
To ED via [facility] from home with c/o fall, states that he had a syncopal episode this am at 11, crawled to bed, called 911- lives with son-

## 2018-07-24 NOTE — ED Notes (Signed)
Patient transported to CT 

## 2018-07-24 NOTE — ED Notes (Signed)
Admitting MD at bedside.

## 2018-07-24 NOTE — ED Notes (Signed)
Pharmacy notified to verify order for pt. due at 9 pm .

## 2018-07-24 NOTE — ED Provider Notes (Addendum)
[facility] EMERGENCY DEPARTMENT Provider Note   CSN: [id number] Arrival date & time: 07/24/18  1446     History   Chief Complaint Chief Complaint  Patient presents with  . Back Pain  . Fall    HPI Phillip Wong is a 71 y.o. male history of hyperlipidemia, hypertension, previous alcohol use here presenting with syncope, back pain.  States that he was at home and was trying to get something from a cabinet and states that he fell cords and hit his back.  He states that he may have passed out but denies any head injury.  States that his back is very painful and he was unable to walk afterwards so was able to crawl to his bed.  He then called 911 afterwards.  EMS noticed stable vitals and unremarkable EKG.  Of note, patient was seen in the ED about a month ago with abdominal pain and stopped drinking alcohol since then.   The history is provided by the patient.    Past Medical History:  Diagnosis Date  . Arthritis   . At risk for sleep apnea    STOP-BANG= 4        SENT TO PCP 11-15-2014  . Chronic constipation   . Chronic low back pain   . First degree heart block   . Hydronephrosis, right   . Hyperlipidemia   . Hypertension   . Hyperthyroidism   . Nephrolithiasis    bilateral -- right is obstructive  . Nocturia   . Right ureteral stone   . Rosacea   . Simple renal cyst    left    Patient Active Problem List   Diagnosis Date Noted  . Hypokalemia 07/24/2018  . Coronary arteriosclerosis after percutaneous transluminal coronary angioplasty (PTCA) 07/13/2017  . Angina pectoris (HCC) 07/11/2017  . Claudication in peripheral vascular disease (HCC) 12/14/2016  . Umbilical hernia-small supraumbilical 09/30/2012    Past Surgical History:  Procedure Laterality Date  . ABDOMINAL AORTOGRAM W/LOWER EXTREMITY N/A 12/15/2016   Procedure: Abdominal Aortogram w/Lower Extremity;  Surgeon: Yates Decamp, MD;  Location: [facility] INVASIVE CV LAB;  Service: Cardiovascular;   Laterality: N/A;  . CORONARY STENT INTERVENTION N/A 07/13/2017   Procedure: CORONARY STENT INTERVENTION;  Surgeon: Yates Decamp, MD;  Location: [facility] INVASIVE CV LAB;  Service: Cardiovascular;  Laterality: N/A;  . CYSTOSCOPY WITH RETROGRADE PYELOGRAM, URETEROSCOPY AND STENT PLACEMENT Right 03/12/2015   Procedure: CYSTO/RIGHT RETROGRADE PYELOGRAM/URETEROSCOPY/STONE EXTRACTION WITH BASKET AND RIGHT STENT PLACEMENT.;  Surgeon: Jerilee Field, MD;  Location: [facility];  Service: Urology;  Laterality: Right;  . CYSTOSCOPY WITH STENT PLACEMENT Right 11/20/2014   Procedure: CYSTOSCOPY WITH RIGHT RETROGRADE PYLEGRAM AND RIGHT URETERAL STENT PLACEMENT;  Surgeon: Jerilee Field, MD;  Location: [facility];  Service: Urology;  Laterality: Right;  . CYSTOSCOPY WITH URETEROSCOPY, STONE BASKETRY AND STENT PLACEMENT Right 12/18/2014   Procedure: CYSTOSCOPY WITH URETEROSCOPY, STONE BASKETRY AND STENT PLACEMENT;  Surgeon: Jerilee Field, MD;  Location: [facility];  Service: Urology;  Laterality: Right;  . HOLMIUM LASER APPLICATION Right 03/12/2015   Procedure: HOLMIUM LASER APPLICATION;  Surgeon: Jerilee Field, MD;  Location: [facility];  Service: Urology;  Laterality: Right;  . LEFT HEART CATH AND CORONARY ANGIOGRAPHY N/A 07/13/2017   Procedure: LEFT HEART CATH AND CORONARY ANGIOGRAPHY;  Surgeon: Yates Decamp, MD;  Location: [facility] INVASIVE CV LAB;  Service: Cardiovascular;  Laterality: N/A;  . PERIPHERAL VASCULAR INTERVENTION  12/15/2016   Procedure: Peripheral Vascular Intervention;  Surgeon:  Yates Decamp, MD;  Location: [facility] INVASIVE CV LAB;  Service: Cardiovascular;;  RCIA  . VIDEO ASSISTED THORACOSCOPY (VATS)/DECORTICATION  2013   pneumonia        Home Medications    Prior to Admission medications   Medication Sig Start Date End Date Taking? Authorizing Provider  allopurinol (ZYLOPRIM) 300 MG tablet Take 300 mg by mouth every morning.     [provider]    ALPRAZolam Prudy Feeler) 0.5 MG tablet Take 0.5 mg by mouth 2 (two) times daily. 03/15/18   [provider]  amitriptyline (ELAVIL) 10 MG tablet Take 10 mg by mouth at bedtime.  11/20/14   [provider]  aspirin EC 81 MG tablet Take 1 tablet (81 mg total) by mouth daily. 12/15/16   Yates Decamp, MD  azithromycin (ZITHROMAX) 250 MG tablet Take 250 mg by mouth See admin instructions. Take 250 mg by mouth twice weekly on Monday and Thursday    [provider]  carvedilol (COREG) 3.125 MG tablet Take 3.125 mg by mouth 2 (two) times daily with a meal.  08/29/12   [provider]  diclofenac sodium (VOLTAREN) 1 % GEL Apply 2 g topically daily as needed (for leg pain).     [provider]  dicyclomine (BENTYL) 20 MG tablet Take 1 tablet (20 mg total) by mouth 2 (two) times daily as needed for spasms (abdominal cramping). 06/08/18   Long, Arlyss Repress, MD  esomeprazole (NEXIUM) 20 MG capsule Take 1 capsule (20 mg total) by mouth daily. 06/17/18   Charlynne Pander, MD  famotidine (PEPCID) 20 MG tablet Take 1 tablet (20 mg total) by mouth 2 (two) times daily. 06/17/18   Charlynne Pander, MD  finasteride (PROSCAR) 5 MG tablet Take 5 mg by mouth daily after breakfast.  09/15/12   [provider]  hydrochlorothiazide (HYDRODIURIL) 25 MG tablet Take 25 mg by mouth every morning.  09/26/12   [provider]  HYDROcodone-acetaminophen (NORCO) 7.5-325 MG per tablet Take 1 tablet by mouth 4 (four) times daily.  08/18/12   [provider]  nitroGLYCERIN (NITROSTAT) 0.4 MG SL tablet Place 0.4 mg under the tongue every 5 (five) minutes as needed for chest pain.  11/06/16   [provider]  polyethylene glycol (MIRALAX / GLYCOLAX) packet Take 17 g by mouth daily as needed for mild constipation or moderate constipation.     [provider]  pravastatin (PRAVACHOL) 40 MG tablet Take 40 mg by mouth every morning.  08/29/12   [provider]   propylthiouracil (PTU) 50 MG tablet Take 50 mg by mouth 2 (two) times daily.  09/15/12   [provider]  senna-docusate (SENOKOT-S) 8.6-50 MG tablet Take 1 tablet by mouth at bedtime as needed for mild constipation or moderate constipation. Patient not taking: Reported on 06/17/2018 06/08/18   Long, Arlyss Repress, MD  tamsulosin (FLOMAX) 0.4 MG CAPS capsule Take 0.4 mg by mouth at bedtime.     [provider]  ticagrelor (BRILINTA) 90 MG TABS tablet Take 1 tablet (90 mg total) by mouth 2 (two) times daily. 07/14/17   Yates Decamp, MD  Vitamin D, Ergocalciferol, (DRISDOL) 50000 units CAPS capsule Take 50,000 Units by mouth every Tuesday.    [provider]    Family History Family History  Problem Relation Age of Onset  . Heart disease Mother     Social History Social History   Tobacco Use  . Smoking status: Former Smoker    Packs/day:  2.00    Years: 40.00    Pack years: 80.00    Types: Cigarettes    Last attempt to quit: 09/28/1998    Years since quitting: 19.8  . Smokeless tobacco: Current User    Types: Snuff  Substance Use Topics  . Alcohol use: Yes    Alcohol/week: 4.0 standard drinks    Types: 4 Cans of beer per week  . Drug use: No     Allergies   Patient has no known allergies.   Review of Systems Review of Systems  Musculoskeletal: Positive for back pain.  Neurological: Positive for syncope.  All other systems reviewed and are negative.    Physical Exam Updated Vital Signs BP 135/75 (BP Location: Right Arm)   Pulse 86   Temp 98.2 F (36.8 C) (Oral)   Resp 18   Ht 5\' 8"  (1.727 m)   Wt 86.2 kg   SpO2 99%   BMI 28.89 kg/m   Physical Exam  Constitutional: He is oriented to person, place, and time.  Uncomfortable   HENT:  Head: Normocephalic and atraumatic.  No scalp hematoma   Eyes: Pupils are equal, round, and reactive to light. Conjunctivae and EOM are normal.  Neck: Normal range of motion. Neck supple.  No midline  tenderness   Cardiovascular: Normal rate and regular rhythm.  Pulmonary/Chest: Effort normal and breath sounds normal. No stridor. No respiratory distress. He has no wheezes.  Abdominal: Soft. Bowel sounds are normal. He exhibits no distension. There is no tenderness.  Musculoskeletal:  + lower lumbar tenderness, swelling R paralumbar area. Dec ROM R hip. Abrasions bilateral knees but nl ROM and no obvious deformity   Neurological: He is alert and oriented to person, place, and time.  No saddle anesthesia, + straight leg raise R side, nl strength and sensation throughout   Skin: Skin is warm.  Psychiatric: He has a normal mood and affect.  Nursing note and vitals reviewed.    ED Treatments / Results  Labs (all labs ordered are listed, but only abnormal results are displayed) Labs Reviewed  CBC WITH DIFFERENTIAL/PLATELET - Abnormal; Notable for the following components:      Result Value   WBC 13.5 (*)    MCHC 36.3 (*)    Neutro Abs 11.2 (*)    All other components within normal limits  COMPREHENSIVE METABOLIC PANEL - Abnormal; Notable for the following components:   Sodium 132 (*)    Potassium 2.6 (*)    Chloride 95 (*)    Glucose, Bld 159 (*)    AST 43 (*)    ALT 52 (*)    Total Bilirubin 2.0 (*)    All other components within normal limits  URINALYSIS, ROUTINE W REFLEX MICROSCOPIC - Abnormal; Notable for the following components:   Hgb urine dipstick LARGE (*)    RBC / HPF >50 (*)    All other components within normal limits  URINE CULTURE  ETHANOL  AMMONIA  I-STAT TROPONIN, ED    EKG EKG Interpretation  Date/Time:  Sunday July 24 2018 14:58:23 EDT Ventricular Rate:  82 PR Interval:    QRS Duration: 78 QT Interval:  373 QTC Calculation: 436 R Axis:   -14 Text Interpretation:  Sinus rhythm Prolonged PR interval Nonspecific T abnormalities, diffuse leads TWI new since previous  Confirmed by Richardean Canal [phone]) on 07/24/2018 3:10:17 PM   Radiology Ct Lumbar  Spine Wo Contrast  Result Date: 07/24/2018 CLINICAL DATA:  Syncope with fall.  Back pain. EXAM: CT LUMBAR SPINE WITHOUT CONTRAST TECHNIQUE: Multidetector CT imaging of the lumbar spine was performed without intravenous contrast administration. Multiplanar CT image reconstructions were also generated. COMPARISON:  CT abdomen pelvis 06/17/2018 FINDINGS: Segmentation: 5 lumbar type vertebrae. Alignment: Normal. Vertebrae: There is an incomplete burst fracture of L1 with less than 25% height loss and no retropulsion. The fracture involves the superior endplate, anterior and posterior walls. No other fracture or focal osseous lesion. Paraspinal and other soft tissues: Calcific aortic atherosclerosis. Right common iliac artery stent. Multiple left renal calculi, measuring up to 16 mm. Disc levels: No spinal canal stenosis. Mild right L4-5 neural foraminal stenosis. IMPRESSION: 1. Incomplete burst fracture of L1 with approximately 10% height loss and no retropulsion. The fracture involves the superior endplate and the anterior and posterior walls. Alignment is normal. 2. Left nephrolithiasis measuring up to 16 mm without hydronephrosis. Aortic Atherosclerosis (ICD10-I70.0). Electronically Signed   By: Deatra Robinson M.D.   On: 07/24/2018 16:54   Dg Hip Unilat W Or Wo Pelvis 2-3 Views Right  Result Date: 07/24/2018 CLINICAL DATA:  Pain after fall EXAM: DG HIP (WITH OR WITHOUT PELVIS) 2-3V RIGHT COMPARISON:  None. FINDINGS: There is no evidence of hip fracture or dislocation. There is no evidence of arthropathy or other focal bone abnormality. IMPRESSION: Negative. Electronically Signed   By: Gerome Sam III M.D   On: 07/24/2018 17:35    Procedures Procedures (including critical care time)  Medications Ordered in ED Medications  potassium chloride 10 mEq in 100 mL IVPB (10 mEq Intravenous New Bag/Given 07/24/18 1834)  HYDROmorphone (DILAUDID) injection 1 mg (1 mg Intravenous Given 07/24/18 1622)   diazepam (VALIUM) injection 5 mg (5 mg Intravenous Given 07/24/18 1552)  sodium chloride 0.9 % bolus 1,000 mL (0 mLs Intravenous Stopped 07/24/18 1807)  potassium chloride SA (K-DUR,KLOR-CON) CR tablet 40 mEq (40 mEq Oral Given 07/24/18 1833)  HYDROmorphone (DILAUDID) injection 1 mg (1 mg Intravenous Given 07/24/18 1816)     Initial Impression / Assessment and Plan / ED Course  I have reviewed the triage vital signs and the nursing notes.  Pertinent labs & imaging results that were available during my care of the patient were reviewed by me and considered in my medical decision making (see chart for details).    Phillip Wong is a 72 y.o. male here with fall. ? Syncope and fell on his back. No head injury. Will get labs, trop, EKG, xray hip, CT lumbar spine. Will give pain meds and reassess.   6:30 pm Labs showed K 2.6. He has no diarrhea or vomiting, just poor PO intake. K supplemented. CT showed L1 burst fracture. I called Dr. Danielle Dess regarding the L1 fracture. He recommend bed rest for now and will see patient as consult. Patient does have TWI that is new that is likely from hypokalemia. Internal medicine to admit for syncope, hypokalemia, L1 fracture.   7:39 PM Dr. Danielle Dess called back again and recommend TLSO brace in AM, bed rest today. He will see patient in AM.   Final Clinical Impressions(s) / ED Diagnoses   Final diagnoses:  Hypokalemia  Closed stable burst fracture of first lumbar vertebra, initial encounter [facility])  Near syncope    ED Discharge Orders    None       Charlynne Pander, MD 07/24/18 Izola Price    Charlynne Pander, MD 07/24/18 [phone]

## 2018-07-25 ENCOUNTER — Ambulatory Visit (HOSPITAL_COMMUNITY): Payer: Medicare Other

## 2018-07-25 DIAGNOSIS — I2510 Atherosclerotic heart disease of native coronary artery without angina pectoris: Secondary | ICD-10-CM

## 2018-07-25 DIAGNOSIS — W19XXXA Unspecified fall, initial encounter: Secondary | ICD-10-CM | POA: Diagnosis not present

## 2018-07-25 DIAGNOSIS — K5900 Constipation, unspecified: Secondary | ICD-10-CM

## 2018-07-25 DIAGNOSIS — G8929 Other chronic pain: Secondary | ICD-10-CM

## 2018-07-25 DIAGNOSIS — E785 Hyperlipidemia, unspecified: Secondary | ICD-10-CM

## 2018-07-25 DIAGNOSIS — N400 Enlarged prostate without lower urinary tract symptoms: Secondary | ICD-10-CM

## 2018-07-25 DIAGNOSIS — S32001A Stable burst fracture of unspecified lumbar vertebra, initial encounter for closed fracture: Secondary | ICD-10-CM

## 2018-07-25 DIAGNOSIS — Z955 Presence of coronary angioplasty implant and graft: Secondary | ICD-10-CM

## 2018-07-25 DIAGNOSIS — Z960 Presence of urogenital implants: Secondary | ICD-10-CM

## 2018-07-25 DIAGNOSIS — Z87442 Personal history of urinary calculi: Secondary | ICD-10-CM

## 2018-07-25 DIAGNOSIS — S32011A Stable burst fracture of first lumbar vertebra, initial encounter for closed fracture: Secondary | ICD-10-CM | POA: Diagnosis not present

## 2018-07-25 DIAGNOSIS — Z7982 Long term (current) use of aspirin: Secondary | ICD-10-CM

## 2018-07-25 DIAGNOSIS — M545 Low back pain: Secondary | ICD-10-CM

## 2018-07-25 DIAGNOSIS — E876 Hypokalemia: Secondary | ICD-10-CM

## 2018-07-25 DIAGNOSIS — K219 Gastro-esophageal reflux disease without esophagitis: Secondary | ICD-10-CM

## 2018-07-25 DIAGNOSIS — F1011 Alcohol abuse, in remission: Secondary | ICD-10-CM

## 2018-07-25 DIAGNOSIS — Z792 Long term (current) use of antibiotics: Secondary | ICD-10-CM

## 2018-07-25 DIAGNOSIS — Z79899 Other long term (current) drug therapy: Secondary | ICD-10-CM

## 2018-07-25 DIAGNOSIS — Z87440 Personal history of urinary (tract) infections: Secondary | ICD-10-CM

## 2018-07-25 DIAGNOSIS — R740 Nonspecific elevation of levels of transaminase and lactic acid dehydrogenase [LDH]: Secondary | ICD-10-CM

## 2018-07-25 DIAGNOSIS — Z87891 Personal history of nicotine dependence: Secondary | ICD-10-CM

## 2018-07-25 DIAGNOSIS — M109 Gout, unspecified: Secondary | ICD-10-CM

## 2018-07-25 DIAGNOSIS — E0590 Thyrotoxicosis, unspecified without thyrotoxic crisis or storm: Secondary | ICD-10-CM

## 2018-07-25 DIAGNOSIS — I10 Essential (primary) hypertension: Secondary | ICD-10-CM

## 2018-07-25 LAB — CBC
HCT: 41.1 % (ref 39.0–52.0)
HEMOGLOBIN: 14.6 g/dL (ref 13.0–17.0)
MCH: 32.7 pg (ref 26.0–34.0)
MCHC: 35.5 g/dL (ref 30.0–36.0)
MCV: 91.9 fL (ref 80.0–100.0)
PLATELETS: 204 10*3/uL (ref 150–400)
RBC: 4.47 MIL/uL (ref 4.22–5.81)
RDW: 12.9 % (ref 11.5–15.5)
WBC: 9.7 10*3/uL (ref 4.0–10.5)
nRBC: 0 % (ref 0.0–0.2)

## 2018-07-25 LAB — BASIC METABOLIC PANEL
Anion gap: 12 (ref 5–15)
BUN: 9 mg/dL (ref 8–23)
CHLORIDE: 97 mmol/L — AB (ref 98–111)
CO2: 24 mmol/L (ref 22–32)
CREATININE: 0.73 mg/dL (ref 0.61–1.24)
Calcium: 8.9 mg/dL (ref 8.9–10.3)
GFR calc non Af Amer: >60 mL/min (ref >60)
Glucose, Bld: 121 mg/dL — ABNORMAL HIGH (ref 70–99)
POTASSIUM: 3.5 mmol/L (ref 3.5–5.1)
Sodium: 133 mmol/L — ABNORMAL LOW (ref 135–145)

## 2018-07-25 LAB — URINE CULTURE: Culture: NO GROWTH

## 2018-07-25 LAB — ECHOCARDIOGRAM COMPLETE
Height: 68 in
Weight: 3100.55 [oz_av]

## 2018-07-25 LAB — HIV ANTIBODY (ROUTINE TESTING W REFLEX): HIV SCREEN 4TH GENERATION: NONREACTIVE

## 2018-07-25 NOTE — Progress Notes (Signed)
Phillip Wong is a 72 y.o. male patient admitted from ED awake, alert - oriented  X 4 - Pt complaining of back pain 10/10 when moved.  VS - Blood pressure 140/69, pulse 88, temperature 98.4 F (36.9 C), temperature source Oral, resp. rate 20, height 5\' 8"  (1.727 m), weight 87.9 kg, SpO2 94 %.    IV in place, occlusive dsg intact without redness.  Orientation to room, and floor completed with information packet given to patient/family.  Patient declined safety video at this time.  Admission INP armband ID verified with patient/family, and in place. Pt placed on tele box 20  Fall assessment complete, with patient able to verbalize understanding of risk associated with falls, and verbalized understanding to call nsg before up out of bed.  Call light within reach, patient able to voice, and demonstrate understanding.  Skin assessment completed and documented.     Will cont to eval and treat per MD orders.  Dierdre Forth, RN 07/25/2018 8:20 AM

## 2018-07-25 NOTE — Discharge Summary (Addendum)
Name: Phillip Wong MRN: [id number] DOB: 04/29/1946 72 y.o. PCP: Tamsen Roers, MD  Date of Admission: 07/24/2018  2:46 PM Date of Discharge:  Attending Physician: Axel Filler, *  Discharge Diagnosis: 1. Syncopal Episode 2. Hypokalemia 3. Lumbar compression fracture 4. Transaminittis   Discharge Medications: Allergies as of 07/29/2018   No Known Allergies     Medication List    STOP taking these medications   hydrochlorothiazide 25 MG tablet Commonly known as:  HYDRODIURIL     TAKE these medications   allopurinol 300 MG tablet Commonly known as:  ZYLOPRIM Take 300 mg by mouth every morning.   amitriptyline 10 MG tablet Commonly known as:  ELAVIL Take 10 mg by mouth at bedtime.   amLODipine 5 MG tablet Commonly known as:  NORVASC Take 5 mg by mouth daily.   aspirin EC 81 MG tablet Take 1 tablet (81 mg total) by mouth daily.   azithromycin 250 MG tablet Commonly known as:  ZITHROMAX Take 250 mg by mouth See admin instructions. Take 250 mg by mouth twice weekly on Monday and Thursday to prevent rosacea   carvedilol 3.125 MG tablet Commonly known as:  COREG Take 3.125 mg by mouth 2 (two) times daily with a meal.   diclofenac sodium 1 % Gel Commonly known as:  VOLTAREN Apply 2 g topically daily as needed (for leg pain).   dicyclomine 20 MG tablet Commonly known as:  BENTYL Take 1 tablet (20 mg total) by mouth 2 (two) times daily as needed for spasms (abdominal cramping).   ENULOSE 10 GM/15ML Soln Generic drug:  lactulose (encephalopathy) Take 10 g by mouth 2 (two) times daily.   esomeprazole 20 MG capsule Commonly known as:  NEXIUM Take 1 capsule (20 mg total) by mouth daily.   finasteride 5 MG tablet Commonly known as:  PROSCAR Take 5 mg by mouth daily after breakfast.   HYDROcodone-acetaminophen 7.5-325 MG tablet Commonly known as:  NORCO Take 1 tablet by mouth every 4 (four) hours as needed for severe pain. What changed:    when  to take this  reasons to take this   nitrofurantoin 50 MG capsule Commonly known as:  MACRODANTIN Take 50 mg by mouth.   nitroGLYCERIN 0.4 MG SL tablet Commonly known as:  NITROSTAT Place 0.4 mg under the tongue every 5 (five) minutes as needed for chest pain.   polyethylene glycol packet Commonly known as:  MIRALAX / GLYCOLAX Take 17 g by mouth daily as needed for mild constipation or moderate constipation.   pravastatin 40 MG tablet Commonly known as:  PRAVACHOL Take 40 mg by mouth every morning.   propylthiouracil 50 MG tablet Commonly known as:  PTU Take 50 mg by mouth 2 (two) times daily.   tamsulosin 0.4 MG Caps capsule Commonly known as:  FLOMAX Take 0.4 mg by mouth at bedtime.   ticagrelor 90 MG Tabs tablet Commonly known as:  BRILINTA Take 1 tablet (90 mg total) by mouth 2 (two) times daily.   Vitamin D (Ergocalciferol) 50000 units Caps capsule Commonly known as:  DRISDOL Take 50,000 Units by mouth every Wednesday.       Disposition and follow-up:   Mr.Gable CHANLER MENDONCA was discharged from [facility] in Good condition.  At the hospital follow up visit please address:  1.  L1 Lumbar Compression Fracture: He was given 3 days hydrocodone-acetaminophen 7.5-325 q4h prn at discharge. After he completes this he may resume his home dose of norco  7.5-325 mg qid prn. Please assess his pain. He has follow up with neurosurgery.  Syncopal Episode: workup negative, thought to be 2/2 to medications. Pt asymptomatic throughout admission Hypokalemia: 2.6 on admission, we discontinued HCTZ but continued lactulose on discharge.  Hypertension: We discontinued HCTZ and continued his home amlodipine and coreg 3.125 mg qd, please reassess if he needs to restart his HCTZ at follow-up.   2.  Labs / imaging needed at time of follow-up: CMP  3.  Pending labs/ test needing follow-up: None  Follow-up Appointments: Follow-up Information    Little, Jeneen Rinks, MD. Schedule  an appointment as soon as possible for a visit.   Specialty:  Family Medicine Why:  1 week Contact information: [address] [place] [address] [phone]           Hospital Course by problem list: 1. Syncope: This is a 72 year old male with a history of CAD s/p stent in left circumflex in 2018,  Nephrolithiasis s/p stents, HTN, HLD, chronic low back pain, and history of EtOH abuse who presented with a syncopal episode and fall. CT head showed no acute abnormalities. He was noted to be on multiple medications including HCTZ, carvedilol, and amlodipine at home and it appears that he was not taking his medications appropriately. It's possible that his was hypokalemia caused by his polypharmacy. Telemetry showed no arrhythmias during his admission. Echocardiogram showed no abnormalities. We discontinued his HCTZ and amlodipine at admission and continued coreg. He was discharged with his amlodipine and coreg to SNF.   2. Hypokalemia: Patient was found to be hypokalemic to 2.6 on admission. He is on lactulose and HCTZ at home and it was thought that this was the cause of the hypokalemia. HCTZ was held during admission and he was given IV and PO K. K improved to 3.9 on discharge and HCTZ held and lactulose continued as needed.  3. Lumbar compression fracture: Patient reported severe back pain that was sharp and stabbing in nature. He reported that this happened after he had the fall from the syncopal event. CT scan showed L1 burst fracture. Neurosurgery was consulted and they recommended back brace and ambulation. They will follow up with him outpatient in four weeks.  4. Transaminittis: History of EtOH and obesity. On admission AST 43, ALT 52, Alk phos 65, TB 2.0. Previous CT scan showed hepatomegaly w/o ascites or obvious cirrhotic changes.  AST, ALT, and alk phos returned to WNL and was stable at discharge.   Discharge Vitals:   BP (!) 141/71 (BP Location: Left Arm)   Pulse 81   Temp 97.8  F (36.6 C) (Oral)   Resp 18   Ht '5\' 8"'  (1.727 m)   Wt 87.9 kg   SpO2 96%   BMI 29.46 kg/m   Pertinent Labs, Studies, and Procedures:    CT head:   IMPRESSION: No acute findings.  Mild chronic ischemic microvascular disease.  Hip X-ray:  FINDINGS: There is no evidence of hip fracture or dislocation. There is no evidence of arthropathy or other focal bone abnormality.  IMPRESSION: Negative.  CT Lumbar Spine WO contrast IMPRESSION: 1. Incomplete burst fracture of L1 with approximately 10% height loss and no retropulsion. The fracture involves the superior endplate and the anterior and posterior walls. Alignment is normal. 2. Left nephrolithiasis measuring up to 16 mm without hydronephrosis. Aortic Atherosclerosis (ICD10-I70.0).  ECHO 10/28: Left ventricle: The cavity size was normal. The estimated   ejection fraction was in the range of  65% to 70%. Wall motion was   normal; there were no regional wall motion abnormalities. Doppler   parameters are consistent with abnormal left ventricular   relaxation (grade 1 diastolic dysfunction). - Mild aortic and mitral valve thickening. No hemodynamically   significant valvular abnormality seen. - Prominent pericardial fat pad seen.  Discharge Instructions: Discharge Instructions    Call MD for:  difficulty breathing, headache or visual disturbances   Complete by:  As directed    Call MD for:  extreme fatigue   Complete by:  As directed    Call MD for:  hives   Complete by:  As directed    Call MD for:  persistant dizziness or light-headedness   Complete by:  As directed    Call MD for:  persistant nausea and vomiting   Complete by:  As directed    Call MD for:  redness, tenderness, or signs of infection (pain, swelling, redness, odor or green/yellow discharge around incision site)   Complete by:  As directed    Call MD for:  severe uncontrolled pain   Complete by:  As directed    Call MD for:  temperature >100.4    Complete by:  As directed    Diet - low sodium heart healthy   Complete by:  As directed    Diet - low sodium heart healthy   Complete by:  As directed    Discharge instructions   Complete by:  As directed    You were hospitalized for syncope and lumbar compression fracture. Thank you for allowing [place] to be part of your care.   We arranged for you to follow up attend rehab with [facility].   Please note these changes made to your medications:   Hydrocodone-acetaminophen 7.5-325 mg every four hours as needed for back pain for the next three days  Please call our clinic if you have any questions or concerns, we may be able to help and keep you from a long and expensive emergency room wait. Our clinic and after hours phone number is [phone], the best time to call is Monday through Friday 9 am to 4 pm but there is always someone available 24/7 if you have an emergency. If you need medication refills please notify your pharmacy one week in advance and they will send [place] a request.   Increase activity slowly   Complete by:  As directed    Increase activity slowly   Complete by:  As directed       Signed: Molli Hazard A, DO 07/29/2018, 11:37 AM   Pager: [phone]

## 2018-07-25 NOTE — Progress Notes (Signed)
[facility] Progress Note Patient Details:  Phillip Wong [birth year] [id number]  Patient ID: [name], male   DOB: [birth year], 72 y.o.   MRN: [id number]   [name] 07/25/2018, 10:37 AMCalled [employer] for TLSO brace.

## 2018-07-25 NOTE — Progress Notes (Signed)
  Echocardiogram 2D Echocardiogram has been performed.  Leta Jungling M 07/25/2018, 3:49 PM

## 2018-07-25 NOTE — Progress Notes (Addendum)
   Subjective: Mr. Noseworthy reports that he is doing okay, no acute events overnight. He states that his back pain is controlled at the moment but he has significant pain with movement. Denies any light headedness of dizziness today. We discussed the plan for today and he is in agreement.   Objective:  Vital signs in last 24 hours: Vitals:   07/25/18 0500 07/25/18 0530 07/25/18 0618 07/25/18 0623  BP: (!) 127/57 120/64  140/69  Pulse: 81 76  88  Resp: _0 Temp:    98.4 F (36.9 C)  TempSrc:    Oral  SpO2: 94% 93%  94%  Weight:   87.9 kg   Height:   _1  (1.727 m)     General: Well nourished, well appearing, NAD  Cardiac: RRR, normal S1, S2, no murmurs, rubs or gallops  Pulmonary: Lungs CTA bilaterally, no wheezing, rhonchi or rales  Extremity: Pain with active and passive movement with flexion of bilateral LE, no LE edema Psychiatry: Normal mood and affect     Assessment/Plan:  Active Problems:   Hypokalemia This is a 72 year old male with a history of CAD s/p stent in left circumflex in 2018,  Nephrolithiasis s/p stents, HTN, HLD, chronic low back pain, and history of EtOH abuse who presented with a syncopal episode and fall. He had  significant hip pain on admission. He was also found to be severely hypokalemic on admission down to 2.6.   ?Syncope: It's unclear if this was a syncopal event per history. He is on multiple medications and it appears that he does not seem to know which medications he is taking at home. He did not have any feelings of lightheadedness, dizziness or feeling like he would pass out just reported a sudden fall. It's possible that this could be orthostatic hypotension 2/2 medications, he is on carvedilol, hctz, and amlodipine at home, he reported that he had the fall after taking his carvedilol. His Bps have been on the lower side today while we are holding these medications.  -Telemetry -Will contact pharmacy about outpatient medications -Continue  to hold HCTZ and amlodipine -Continue carvedilol at lower dose -Echocardiogram  Hypokalemia: On admission K was 2.6. Is on lactulose and HCTZ at home and it's unclear if that is was he was taking. Replaced with oral and IV K. Today K was 3.5.  -Holding lactulose and HCTZ -Replace K to >4 -BMP in AM  Back pain 2/2 Lumbar compression fracture 2/2 fall: L1 burst fracture seen on CT scan. NSG consulted and will see today.  -Continue Percocet 5-338m q6 hour PRN -Continue Oxy IR 5 mg q4 PRN -PT/OT after NSG recommendations  Transaminitis: History of EtOH and obesity. On admission AST 43, ALT 52, Alk phos 65, TB 2.0. Previous CT scan showed hepatomegaly w/o ascites or obvious cirrhotic changes.  -Checking HIV and Hep panel -CMP in AM  CAD s/p stents: Continue home ASA, ticagrelor, pravastatin, and lower dose of carvedilol  FEN: No fluids, replete lytes prn, cardiac diet VTE ppx: Lovenox  Code Status: FULL    Dispo: Anticipated discharge is pending clinical improvement.   KAsencion Noble MD 07/25/2018, 6:43 AM Pager: [phone]

## 2018-07-26 DIAGNOSIS — G8929 Other chronic pain: Secondary | ICD-10-CM | POA: Diagnosis not present

## 2018-07-26 DIAGNOSIS — W19XXXA Unspecified fall, initial encounter: Secondary | ICD-10-CM | POA: Diagnosis not present

## 2018-07-26 DIAGNOSIS — M545 Low back pain: Secondary | ICD-10-CM | POA: Diagnosis not present

## 2018-07-26 DIAGNOSIS — S32011A Stable burst fracture of first lumbar vertebra, initial encounter for closed fracture: Secondary | ICD-10-CM | POA: Diagnosis not present

## 2018-07-26 LAB — COMPREHENSIVE METABOLIC PANEL
ALK PHOS: 59 U/L (ref 38–126)
ALT: 39 U/L (ref 0–44)
ANION GAP: 11 (ref 5–15)
AST: 29 U/L (ref 15–41)
Albumin: 3.4 g/dL — ABNORMAL LOW (ref 3.5–5.0)
BUN: 6 mg/dL — ABNORMAL LOW (ref 8–23)
CALCIUM: 8.5 mg/dL — AB (ref 8.9–10.3)
CO2: 22 mmol/L (ref 22–32)
CREATININE: 0.56 mg/dL — AB (ref 0.61–1.24)
Chloride: 98 mmol/L (ref 98–111)
Glucose, Bld: 132 mg/dL — ABNORMAL HIGH (ref 70–99)
Potassium: 3.1 mmol/L — ABNORMAL LOW (ref 3.5–5.1)
Sodium: 131 mmol/L — ABNORMAL LOW (ref 135–145)
Total Bilirubin: 2.3 mg/dL — ABNORMAL HIGH (ref 0.3–1.2)
Total Protein: 6.8 g/dL (ref 6.5–8.1)

## 2018-07-26 LAB — HCV INTERPRETATION

## 2018-07-26 LAB — HEPATITIS B SURFACE ANTIGEN: Hepatitis B Surface Ag: NEGATIVE

## 2018-07-26 LAB — HEPATITIS A ANTIBODY, TOTAL: HEP A TOTAL AB: NEGATIVE

## 2018-07-26 LAB — HEPATITIS B CORE ANTIBODY, TOTAL: HEP B C TOTAL AB: NEGATIVE

## 2018-07-26 LAB — HCV AB W REFLEX TO QUANT PCR

## 2018-07-26 MED ORDER — POTASSIUM CHLORIDE CRYS ER 20 MEQ PO TBCR
40.0000 meq | EXTENDED_RELEASE_TABLET | Freq: Two times a day (BID) | ORAL | Status: AC
  Administered 2018-07-26 (×2): 40 meq via ORAL
  Filled 2018-07-26 (×2): qty 2

## 2018-07-26 MED ORDER — KETOROLAC TROMETHAMINE 10 MG PO TABS
10.0000 mg | ORAL_TABLET | Freq: Four times a day (QID) | ORAL | Status: DC | PRN
  Filled 2018-07-26: qty 1

## 2018-07-26 MED ORDER — METHOCARBAMOL 500 MG PO TABS
500.0000 mg | ORAL_TABLET | Freq: Four times a day (QID) | ORAL | Status: DC | PRN
  Administered 2018-07-26 – 2018-07-28 (×3): 500 mg via ORAL
  Filled 2018-07-26 (×3): qty 1

## 2018-07-26 NOTE — Progress Notes (Signed)
   Subjective: Phillip Wong was doing okay, no acute events overnight. He reports that his pain is improved and he only feels pain when he moves. He reports that he has been able to more his legs more compared to when he came in. He denies any new complaints such s light headedness or dizziness. We discussed the plan for today and he was in agreement.   Objective:  Vital signs in last 24 hours: Vitals:   07/25/18 1307 07/25/18 1310 07/25/18 2046 07/26/18 0516  BP: 132/67  136/73 (!) 166/74  Pulse: (!) 111 82 74 91  Resp: '19  18 17  '$ Temp: 97.9 F (36.6 C)  97.9 F (36.6 C) 98.5 F (36.9 C)  TempSrc: Oral  Oral Oral  SpO2: 93%  93% 94%  Weight:      Height:        General: Well nourished, well appearing, NAD  Cardiac: RRR, normal S1, S2, no murmurs, rubs or gallops  Pulmonary: Lungs CTA bilaterally, no wheezing, rhonchi or rales  Extremity: No pain with lower extremity flexion, no LE edema Psychiatry: Normal mood and affect     Assessment/Plan:  Principal Problem:   Lumbar burst fracture (HCC) Active Problems:   Hypokalemia  This is a 72 year old male with a history of CAD s/p stent in left circumflex in 2018,  Nephrolithiasis s/p stents, HTN, HLD, chronic low back pain, and history of EtOH abuse who presented with a syncopal episode and fall. He had  significant hip pain on admission. He was also found to be severely hypokalemic on admission down to 2.6.   ?Syncope: Telemetry has shown no acute findings. Echocardiogram was normal. He is on multiple blood pressure medications and he reported that he had the syncope after taking his beta blocker at home. He is on HCTZ and amlodipine at home as well. He denied any new events overnight. It's possible that this was hypotension 2/2 polypharmacy, it appears that his reported medication regimen is different from the prescribed medication.  -Continue telemetry -Orthostatics after PT/OT eval -Continue to hold HCTZ and  amlodipine -Continue carvedilol at lower dose  Hypokalemia: On admission K was 2.6. Is on lactulose and HCTZ at home and it's unclear if that is was he was taking. Replaced with oral and IV K. Today K was 3.1.  -K-dur 40 mg BID for 2 doses -Holding lactulose and HCTZ -Replace K to >4 -BMP in evening and AM  Back pain 2/2 Lumbar compression fracture 2/2 fall: L1 burst fracture seen on CT scan. NSG consulted and will see today.  -Continue Percocet 5-'325mg'$  q6 hour PRN -Continue Oxy IR 5 mg q4 PRN -PT/OT  -Back brace -Add Toradol and flexeril  -NSG on board, appreciate recommendations  Transaminitis: History of EtOH and obesity. On admission AST 43, ALT 52, Alk phos 65, TB 2.0. Previous CT scan showed hepatomegaly w/o ascites or obvious cirrhotic changes.  -Today AST, ALT,a nd alk phos WNL. Stable.   -HIV and Hep panel all negative  CAD s/p stents: Continue home ASA, ticagrelor, pravastatin, and lower dose of carvedilol.   FEN: No fluids, replete lytes prn, cardiac diet VTE ppx: Lovenox  Code Status: FULL    Dispo: Anticipated discharge is pending clinical improvement.   Asencion Noble, MD 07/26/2018, 7:10 AM Pager: [phone]

## 2018-07-26 NOTE — Clinical Social Work Note (Addendum)
Clinical Social Work Assessment  Patient Details  Name: Phillip Wong MRN: [id number] Date of Birth: [birth year]  Date of referral:  07/26/18               Reason for consult:  Facility Placement                Permission sought to share information with:  Facility Medical sales representative, Family Supports Permission granted to share information::  Yes, Verbal Permission Granted  Name::     Teacher, adult education::  SNFs  Relationship::  Son  Contact Information:  [phone]  Housing/Transportation Living arrangements for the past 2 months:  Single Family Home Source of Information:  Patient Patient Interpreter Needed:  None Criminal Activity/Legal Involvement Pertinent to Current Situation/Hospitalization:  No - Comment as needed Significant Relationships:  Adult Children Lives with:  Self Do you feel safe going back to the place where you live?  No Need for family participation in patient care:  No (Coment)  Care giving concerns:  CSW received consult for possible SNF placement at time of discharge. CSW spoke with patient regarding PT recommendation of SNF placement at time of discharge. Patient reported that he does not have much help at home. He is unsure about going to SNF but is in agreement for referral .CSW to continue to follow and assist with discharge planning needs.   Social Worker assessment / plan:  CSW spoke with patient concerning possibility of rehab at [facility] before returning home.  Employment status:  Retired Database administrator PT Recommendations:  Skilled Nursing Facility Information / Referral to community resources:  Skilled Nursing Facility  Patient/Family's Response to care:  Patient recognizes need for rehab before returning home and may be interested in SNF placement. He would like to review the list and speak with his family. CSW left SNF list with patient and went over insurance authorization requirements.   Patient/Family's Understanding of  and Emotional Response to Diagnosis, Current Treatment, and Prognosis:  Patient/family is realistic regarding therapy needs and expressed being hopeful for less pain. Patient expressed understanding of CSW role and discharge process as well as medical condition. No questions/concerns about plan or treatment.    Emotional Assessment Appearance:  Appears stated age Attitude/Demeanor/Rapport:  Guarded Affect (typically observed):  Appropriate, Guarded Orientation:  Oriented to Self, Oriented to Place, Oriented to  Time, Oriented to Situation Alcohol / Substance use:  Not Applicable Psych involvement (Current and /or in the community):  No (Comment)  Discharge Needs  Concerns to be addressed:  Care Coordination Readmission within the last 30 days:  No Current discharge risk:  Dependent with Mobility Barriers to Discharge:  Continued Medical Work up   [employer], LCSW 07/26/2018, 1:31 PM

## 2018-07-26 NOTE — NC FL2 (Signed)
[place] MEDICAID FL2 LEVEL OF CARE SCREENING TOOL     IDENTIFICATION  Patient Name: Phillip Wong Birthdate: 13-Feb-1946 Sex: male Admission Date (Current Location): 07/24/2018  [facility] and [place] Number:  [employer] and Address:  [facility], [address] [place], [place] [address]      Provider Number: [id number]  Attending Physician Name and Address:  Earl Lagos, MD  Relative Name and Phone Number:  Pritesh, son, [phone]    Current Level of Care: Hospital Recommended Level of Care: Skilled Nursing Facility Prior Approval Number:    Date Approved/Denied:   PASRR Number: [id number]  Discharge Plan: SNF    Current Diagnoses: Patient Active Problem List   Diagnosis Date Noted  . Lumbar burst fracture (HCC) 07/25/2018  . Hypokalemia 07/24/2018  . Coronary arteriosclerosis after percutaneous transluminal coronary angioplasty (PTCA) 07/13/2017  . Angina pectoris (HCC) 07/11/2017  . Claudication in peripheral vascular disease (HCC) 12/14/2016  . Umbilical hernia-small supraumbilical 09/30/2012    Orientation RESPIRATION BLADDER Height & Weight     Self, Time, Situation, Place  Normal Continent Weight: 87.9 kg Height:  5\' 8"  (172.7 cm)  BEHAVIORAL SYMPTOMS/MOOD NEUROLOGICAL BOWEL NUTRITION STATUS      Continent Diet(Please see DC Summary)  AMBULATORY STATUS COMMUNICATION OF NEEDS Skin   Extensive Assist Verbally Normal                       Personal Care Assistance Level of Assistance  Bathing, Feeding, Dressing Bathing Assistance: Maximum assistance Feeding assistance: Limited assistance Dressing Assistance: Limited assistance     Functional Limitations Info  Sight, Hearing, Speech Sight Info: Adequate Hearing Info: Adequate Speech Info: Adequate    SPECIAL CARE FACTORS FREQUENCY  PT (By licensed PT), OT (By licensed OT)     PT Frequency: 5x/week OT Frequency: 3x/week             Contractures Contractures Info: Not present    Additional Factors Info  Code Status, Allergies Code Status Info: Full Allergies Info: NKA           Current Medications (07/26/2018):  This is the current hospital active medication list Current Facility-Administered Medications  Medication Dose Route Frequency Provider Last Rate Last Dose  . allopurinol (ZYLOPRIM) tablet 300 mg  300 mg Oral q morning - 10a Nyra Market, MD   300 mg at 07/26/18 0857  . amitriptyline (ELAVIL) tablet 10 mg  10 mg Oral QHS Nyra Market, MD   10 mg at 07/25/18 2201  . aspirin EC tablet 81 mg  81 mg Oral Daily Nyra Market, MD   81 mg at 07/26/18 0858  . azithromycin (ZITHROMAX) tablet 250 mg  250 mg Oral Once per day on Mon Thu Svalina, Gorica, MD   250 mg at 07/25/18 8413  . carvedilol (COREG) tablet 3.125 mg  3.125 mg Oral BID WC Nyra Market, MD   3.125 mg at 07/26/18 0856  . enoxaparin (LOVENOX) injection 40 mg  40 mg Subcutaneous Q24H Nyra Market, MD   40 mg at 07/25/18 2203  . finasteride (PROSCAR) tablet 5 mg  5 mg Oral Daily Nyra Market, MD   5 mg at 07/26/18 0857  . folic acid (FOLVITE) tablet 1 mg  1 mg Oral Daily Nyra Market, MD   1 mg at 07/26/18 0856  . lactulose (CHRONULAC) 10 GM/15ML solution 20 g  20 g Oral Daily PRN Nyra Market, MD   20 g at 07/25/18  1610  . methocarbamol (ROBAXIN) tablet 500 mg  500 mg Oral Q6H PRN Claudean Severance, MD      . oxyCODONE (Oxy IR/ROXICODONE) immediate release tablet 5 mg  5 mg Oral Q4H PRN Nyra Market, MD   5 mg at 07/26/18 1247  . oxyCODONE-acetaminophen (PERCOCET/ROXICET) 5-325 MG per tablet 1 tablet  1 tablet Oral Q6H PRN Nyra Market, MD   1 tablet at 07/26/18 0821  . pantoprazole (PROTONIX) EC tablet 40 mg  40 mg Oral Daily Nyra Market, MD   40 mg at 07/26/18 0856  . polyethylene glycol (MIRALAX / GLYCOLAX) packet 17 g  17 g Oral Daily Nyra Market, MD   17 g at 07/26/18 1000  . potassium chloride SA  (K-DUR,KLOR-CON) CR tablet 40 mEq  40 mEq Oral BID Hermine Messick M, MD      . pravastatin (PRAVACHOL) tablet 40 mg  40 mg Oral q morning - 10a Nyra Market, MD   40 mg at 07/26/18 0857  . propylthiouracil (PTU) tablet 50 mg  50 mg Oral BID Nyra Market, MD   50 mg at 07/26/18 0858  . senna (SENOKOT) tablet 8.6 mg  1 tablet Oral BID Nyra Market, MD   8.6 mg at 07/26/18 0857  . tamsulosin (FLOMAX) capsule 0.4 mg  0.4 mg Oral QHS Nyra Market, MD   0.4 mg at 07/25/18 2202  . thiamine (VITAMIN B-1) tablet 100 mg  100 mg Oral Daily Nyra Market, MD   100 mg at 07/26/18 0855  . ticagrelor (BRILINTA) tablet 90 mg  90 mg Oral BID Nyra Market, MD   90 mg at 07/26/18 9604     Discharge Medications: Please see discharge summary for a list of discharge medications.  Relevant Imaging Results:  Relevant Lab Results:   Additional Information SSN: [id number] [place], [place]

## 2018-07-26 NOTE — Progress Notes (Signed)
Patient ID: Phillip Wong, male   DOB: [birth year], 72 y.o.   MRN: [id number] Phillip Wong appears much more comfortable this evening.  He is started to mobilize well.  Okay for discharge follow-up visit in 4 weeks time with x-ray.

## 2018-07-26 NOTE — Evaluation (Signed)
Physical Therapy Evaluation Patient Details Name: Phillip Wong MRN: [id number] DOB: 23-Sep-1946 Today's Date: 07/26/2018   History of Present Illness  Phillip Wong is a 72 y.o. male history of hyperlipidemia, hypertension, previous alcohol use here presenting with syncope, back pain.  States that he was at home and was trying to get something from a cabinet and states that he fell and hit his back causing an L1 burst fracture   Clinical Impression  Pt presents with increased pain, decreased amb and decreased balance due to L1 burst fracture. Pt was total assist with increased pain for bed mobility to put on his TLSO and min assist with log rolling. Pt required max assist for supine to sit EOB and sit to stand with the RW due to increased pain. Pt will benefit from PT while in the acute care setting.    Follow Up Recommendations SNF    Equipment Recommendations  None recommended by PT    Recommendations for Other Services       Precautions / Restrictions Precautions Precautions: Back Precaution Comments: Attempted to clarify with MD donning postion for brace with no reply and donned in supine.  Required Braces or Orthoses: Spinal Brace Spinal Brace: Thoracolumbosacral orthotic;Applied in supine position Restrictions Weight Bearing Restrictions: No      Mobility  Bed Mobility Overal bed mobility: Needs Assistance Bed Mobility: Sidelying to Sit   Sidelying to sit: Total assist       General bed mobility comments: Pt required total assist due to pain and log roll technique to put on TLSO brace in supine. Pt was taught how to log roll with verbal and tactile cueing to increase understanding. Pt was min assist with log roll. Pt had to have the brace adjusted when sitting EOB due to discomfort   Transfers Overall transfer level: Needs assistance Equipment used: Rolling walker (2 wheeled) Transfers: Sit to/from [employer] Sit to Stand: Max assist Stand pivot  transfers: Mod assist;+2 safety/equipment       General transfer comment: Pt required mod assist from sit to stand due to pain and stiffness. Pt walked from bed to chair at the edge of the bed. Pt required mod assist due to increased pain with standing and SPT.   Ambulation/Gait                Stairs            Wheelchair Mobility    Modified Rankin (Stroke Patients Only)       Balance Overall balance assessment: Needs assistance Sitting-balance support: Bilateral upper extremity supported;Feet supported Sitting balance-Leahy Scale: Poor Sitting balance - Comments: Pt had decreased sitting balance due to increased pain and generalized weakness. Postural control: Right lateral lean Standing balance support: Bilateral upper extremity supported Standing balance-Leahy Scale: Poor Standing balance comment: Pt required the walker and mod assist to stand up due to increased pain.                             Pertinent Vitals/Pain Pain Assessment: 0-10 Pain Score: 2  Pain Location: pain in lower back while in supine Pain Descriptors / Indicators: Sharp    Home Living Family/patient expects to be discharged to:: Private residence Living Arrangements: Children Available Help at Discharge: Family Type of Home: House Home Access: Stairs to enter Entrance Stairs-Rails: None Entrance Stairs-Number of Steps: 5 in front and 3 in back Home Layout: One level Home Equipment: None  Prior Function Level of Independence: Independent               Hand Dominance   Dominant Hand: Right    Extremity/Trunk Assessment   Upper Extremity Assessment Upper Extremity Assessment: Overall WFL for tasks assessed    Lower Extremity Assessment Lower Extremity Assessment: Overall WFL for tasks assessed;Generalized weakness       Communication   Communication: No difficulties  Cognition Arousal/Alertness: Awake/alert Behavior During Therapy: WFL for tasks  assessed/performed Overall Cognitive Status: Within Functional Limits for tasks assessed                                        General Comments General comments (skin integrity, edema, etc.): Pt reported that his son works during the week but would be able to help take care of him when he was off work.     Exercises     Assessment/Plan    PT Assessment Patient needs continued PT services  PT Problem List Decreased strength;Decreased mobility;Decreased activity tolerance;Pain;Decreased balance       PT Treatment Interventions Therapeutic activities;DME instruction;Gait training;Therapeutic exercise;Stair training;Balance training;Patient/family education;Functional mobility training    PT Goals (Current goals can be found in the Care Plan section)  Acute Rehab PT Goals Patient Stated Goal: To go home PT Goal Formulation: With patient Time For Goal Achievement: 08/09/18 Potential to Achieve Goals: Good    Frequency Min 5X/week   Barriers to discharge        Co-evaluation               AM-PAC PT "6 Clicks" Daily Activity  Outcome Measure Difficulty turning over in bed (including adjusting bedclothes, sheets and blankets)?: Unable Difficulty moving from lying on back to sitting on the side of the bed? : Unable Difficulty sitting down on and standing up from a chair with arms (e.g., wheelchair, bedside commode, etc,.)?: Unable Help needed moving to and from a bed to chair (including a wheelchair)?: Total Help needed walking in hospital room?: Total Help needed climbing 3-5 steps with a railing? : Total 6 Click Score: 6    End of Session Equipment Utilized During Treatment: Gait belt;Back brace Activity Tolerance: Patient limited by pain Patient left: in chair;with chair alarm set;with call bell/phone within reach   PT Visit Diagnosis: Unsteadiness on feet (R26.81);Pain;Difficulty in walking, not elsewhere classified (R26.2) Pain - part of body:  (lower back)    Time: 1610-9604 PT Time Calculation (min) (ACUTE ONLY): 33 min   Charges:   PT Evaluation $PT Eval Moderate Complexity: 1 Mod PT Treatments $Therapeutic Activity: 8-22 mins        [employer] SPT 07/26/2018   Sanjuana Letters 07/26/2018, 1:34 PM

## 2018-07-26 NOTE — Progress Notes (Signed)
Internal Medicine Attending:   I saw and examined the patient. I reviewed the resident's note and I agree with the resident's findings and plan as documented in the resident's note.  Patient states that his back pain is mildly improved today.  He also states that he has been able to move his legs off the bed which he had difficulty doing when he came in.  Patient was initially admitted to the hospital with possible syncope as well as worsening lower back pain after a fall.  Patient was found to have an L1 burst fracture on imaging.  Syncope work-up has been negative thus far.  It is possible that he had an arrhythmia causing his syncope in the setting of severe hypokalemia (likely medication induced).  No arrhythmia noted on telemetry monitoring in the hospital.  Will supplement electrolytes as needed.  Continue to hold lactulose and HCTZ for now.  Neurosurgery follow-up recommendations appreciated.  Patient to be treated conservatively with a back brace. PT/OT follow-up and recommendations appreciated.  Patient will need SNF placement on discharge.  Continue with pain control for now.  Patient will likely be stable for DC to SNF tomorrow.

## 2018-07-27 DIAGNOSIS — G8929 Other chronic pain: Secondary | ICD-10-CM | POA: Diagnosis not present

## 2018-07-27 DIAGNOSIS — M545 Low back pain: Secondary | ICD-10-CM | POA: Diagnosis not present

## 2018-07-27 DIAGNOSIS — S32011A Stable burst fracture of first lumbar vertebra, initial encounter for closed fracture: Secondary | ICD-10-CM | POA: Diagnosis not present

## 2018-07-27 DIAGNOSIS — W19XXXA Unspecified fall, initial encounter: Secondary | ICD-10-CM | POA: Diagnosis not present

## 2018-07-27 LAB — BASIC METABOLIC PANEL
Anion gap: 7 (ref 5–15)
BUN: 10 mg/dL (ref 8–23)
CHLORIDE: 102 mmol/L (ref 98–111)
CO2: 23 mmol/L (ref 22–32)
Calcium: 8.5 mg/dL — ABNORMAL LOW (ref 8.9–10.3)
Creatinine, Ser: 0.73 mg/dL (ref 0.61–1.24)
GFR calc non Af Amer: >60 mL/min (ref >60)
Glucose, Bld: 112 mg/dL — ABNORMAL HIGH (ref 70–99)
POTASSIUM: 3.9 mmol/L (ref 3.5–5.1)
Sodium: 132 mmol/L — ABNORMAL LOW (ref 135–145)

## 2018-07-27 MED ORDER — ENSURE ENLIVE PO LIQD
237.0000 mL | Freq: Two times a day (BID) | ORAL | Status: DC
  Administered 2018-07-28 – 2018-07-29 (×3): 237 mL via ORAL

## 2018-07-27 MED ORDER — OXYCODONE-ACETAMINOPHEN 5-325 MG PO TABS
1.0000 | ORAL_TABLET | Freq: Four times a day (QID) | ORAL | Status: DC | PRN
  Administered 2018-07-27: 1 via ORAL
  Filled 2018-07-27: qty 1

## 2018-07-27 MED ORDER — INFLUENZA VAC SPLIT HIGH-DOSE 0.5 ML IM SUSY: 0.5000 mL | PREFILLED_SYRINGE | INTRAMUSCULAR | Status: DC | PRN

## 2018-07-27 MED ORDER — OXYCODONE-ACETAMINOPHEN 5-325 MG PO TABS
1.0000 | ORAL_TABLET | ORAL | Status: DC | PRN
  Administered 2018-07-27 – 2018-07-29 (×5): 1 via ORAL
  Filled 2018-07-27 (×5): qty 1

## 2018-07-27 NOTE — Progress Notes (Signed)
Subjective: Mr. Merriweather was doing okay today, no acute events overnight.  He reports that his pain is manageable, that it only occurs when he is moving.  He was on Norco at home and he reports that he felt like this was wearing out faster. He reports that he feels like the pain regimen here is doing okay.  He worked with physical therapy yesterday and reports that he was able to get up to the chair.  We discussed plan for today and he is in agreement.  Objective:  Vital signs in last 24 hours: Vitals:   07/26/18 0516 07/26/18 1413 07/26/18 2140 07/27/18 0540  BP: (!) 166/74 99/76 (!) 131/94 (!) 153/74  Pulse: 91 92 85 82  Resp: _0 Temp: 98.5 F (36.9 C) 98.2 F (36.8 C) 98.8 F (37.1 C) 99 F (37.2 C)  TempSrc: Oral Oral    SpO2: 94% 94% 92% 94%  Weight:      Height:        General: Well nourished, well appearing, NAD  Cardiac: RRR, normal S1, S2, no murmurs, rubs or gallops  Pulmonary: Lungs CTA bilaterally, no wheezing, rhonchi or rales  Abdomen: Soft, non-tender, +bowel sounds, no guarding or masses noted  Psychiatry: Normal mood and affect     Assessment/Plan:  Principal Problem:   Lumbar burst fracture (HCC) Active Problems:   Hypokalemia  This is a 72 year old male with a history of CAD s/p stent in left circumflex in 2018,Nephrolithiasis s/p stents,HTN, HLD, chronic low back pain, and history of EtOH abuse who presented with a syncopal episode and fall. Hehadsignificant hip pain on admission.He was also found to be severely hypokalemic on admission down to 2.6.  ?Syncope: Work up has been negative, telemetry has shown no arrhythmias, echocardiogram was normal. He is on multiple blood pressure medications and he reported that he had the syncope after taking his beta blocker at home. He is on HCTZ and amlodipine at home as well. It's possible that this could be related to his hypokalemia that he had on ecam vs orthostatic hypotension 2/2 polypharmacy.    -Continue telemetry -Orthostatics after PT/OT eval -Continue to hold HCTZ and amlodipine -Continue carvedilol at lower dose  Hypokalemia:On admission K was 2.6. Is on lactulose and HCTZ at home and it's unclear if that is was he was taking. Replaced with oral and IV K. Today K was 3.6.  -Holding lactulose and HCTZ.  Will likely discontinue on discharge. -Replace K to >4 -BMP in AM  Back pain 2/2 Lumbar compression fracture 2/2 fall:L1 burst fracture seen on CT scan.  Patient's pain is well controlled, he has not received any Toradol.  He has been using Percocet and OxyIR. -[facility] consulted, they recommended back brace, mobilization, and follow up with them in 4 weeks.  -Continue Percocet 5-372m q6 hour PRN for severe pain -Continue Oxy IR 5 mg q4 PRN -PT/OT: Recommended SNF -CSW assisting with this -Back brace -Discontinue Toradol  -Continue Flexeril   Transaminitis: History of EtOH and obesity. On admissionAST 43, ALT 52, Alk phos 65, TB 2.0. Previous CT scan showed hepatomegaly w/o ascites or obvious cirrhotic changes.  -AST, ALT, and alk phos has been stable.   -HIV and Hep panel all negative  CAD s/p stents: Continue home ASA, ticagrelor, pravastatin, and lower dose of carvedilol.   FEN:No fluids, replete lytes prn,cardiacdiet VTE ppx: Lovenox  Code Status: FULL   Dispo: Anticipated dischargeis pending SNF  Egidio Lofgren M,  MD 07/27/2018, 6:37 AM Pager: [phone]

## 2018-07-27 NOTE — Progress Notes (Signed)
Occupational Therapy Treatment Patient Details Name: Phillip Wong MRN: [id number] DOB: 1946/06/09 Today's Date: 07/27/2018    History of present illness Phillip Wong is a 72 y.o. male history of hyperlipidemia, hypertension, previous alcohol use here presenting with syncope, back pain.  States that he was at home and was trying to get something from a cabinet and states that he fell and hit his back causing an L1 burst fracture    OT comments  Return for second visit to address bathing and brace management. Pt requiring Min A for UB bathing and Mod A for LB bathing. Providing education on brace management and positioning; pt requiring Mod A for donning brace and Max cues for sequencing steps. Provided pt with back handout to optimize understanding of precautions and compensatory techniques. Continue to recommend dc to SNF and will continue to follow acutely as admitted.    Follow Up Recommendations  SNF;Supervision/Assistance - 24 hour    Equipment Recommendations  3 in 1 bedside commode    Recommendations for Other Services PT consult    Precautions / Restrictions Precautions Precautions: Back Precaution Booklet Issued: Yes (comment) Precaution Comments: Applied TLSO in supine. Educated pt on back precautions.  Required Braces or Orthoses: Spinal Brace Spinal Brace: Thoracolumbosacral orthotic;Applied in supine position;Other (comment)(Off for showers) Restrictions Weight Bearing Restrictions: Wong       Mobility Bed Mobility Overal bed mobility: Needs Assistance Bed Mobility: Rolling;Sidelying to Sit Rolling: Mod assist Sidelying to sit: Mod assist       General bed mobility comments: In recliner  Transfers Overall transfer level: Needs assistance Equipment used: Rolling walker (2 wheeled) Transfers: Sit to/from Stand Sit to Stand: Min assist         General transfer comment: Min A to power up from EOB. Cues for hand placement    Balance Overall balance  assessment: Needs assistance Sitting-balance support: Bilateral upper extremity supported;Feet supported Sitting balance-Leahy Scale: Fair     Standing balance support: Bilateral upper extremity supported Standing balance-Leahy Scale: Poor Standing balance comment: Pt required the walker and mod assist to stand up due to increased pain.                           ADL either performed or assessed with clinical judgement   ADL Overall ADL's : Needs assistance/impaired Eating/Feeding: Set up;Sitting   Grooming: Set up;Sitting   Upper Body Bathing: Minimal assistance;Sitting Upper Body Bathing Details (indicate cue type and reason): Pt performing UB bathing while seated in recliner with Min A for washing his back Lower Body Bathing: Moderate assistance;Sit to/from stand Lower Body Bathing Details (indicate cue type and reason): Pt performing LB bathing to bathe his upper thighs and peri area while seated in recliner Upper Body Dressing : Moderate assistance;Sitting Upper Body Dressing Details (indicate cue type and reason): Educating pt on brace management. Providing step by step demonstration as well as written instructions.  Lower Body Dressing: Maximal assistance;Sit to/from stand Lower Body Dressing Details (indicate cue type and reason): Pt unable to bring ankles to knees due to pain.  Toilet Transfer: Minimal assistance;RW;Ambulation Statistician Details (indicate cue type and reason): Min A for power up into standing.          Functional mobility during ADLs: Minimal assistance;Rolling walker General ADL Comments: Focused session on bathing and brace education.      Vision Baseline Vision/History: Wears glasses Wears Glasses: Reading only Patient Visual Report: Wong change from  baseline     Perception     Praxis      Cognition Arousal/Alertness: Awake/alert Behavior During Therapy: WFL for tasks assessed/performed Overall Cognitive Status: Within  Functional Limits for tasks assessed                                          Exercises     Shoulder Instructions       General Comments Returning for second visit to compelte bathing and provide education on bathing and brace management.     Pertinent Vitals/ Pain       Pain Assessment: Faces Faces Pain Scale: Hurts even more Pain Location: lower back  Pain Descriptors / Indicators: Sharp Pain Intervention(s): Monitored during session;Repositioned  Home Living Family/patient expects to be discharged to:: Private residence Living Arrangements: Children Available Help at Discharge: Family Type of Home: House Home Access: Stairs to enter Secretary/administrator of Steps: 5 in front and 3 in back Entrance Stairs-Rails: None Home Layout: One level     Bathroom Shower/Tub: Tub/shower unit         Home Equipment: None          Prior Functioning/Environment Level of Independence: Independent            Frequency  Min 2X/week        Progress Toward Goals  OT Goals(current goals can now be found in the care plan section)  Progress towards OT goals: Progressing toward goals  Acute Rehab OT Goals Patient Stated Goal: To go home OT Goal Formulation: With patient Time For Goal Achievement: 08/10/18 Potential to Achieve Goals: Good  Plan Discharge plan remains appropriate    Co-evaluation                 AM-PAC PT "6 Clicks" Daily Activity     Outcome Measure   Help from another person eating meals?: None Help from another person taking care of personal grooming?: A Little Help from another person toileting, which includes using toliet, bedpan, or urinal?: A Lot Help from another person bathing (including washing, rinsing, drying)?: A Lot Help from another person to put on and taking off regular upper body clothing?: A Lot Help from another person to put on and taking off regular lower body clothing?: A Lot 6 Click Score: 15     End of Session Equipment Utilized During Treatment: Rolling walker;Back brace  OT Visit Diagnosis: Unsteadiness on feet (R26.81);Other abnormalities of gait and mobility (R26.89)   Activity Tolerance Patient limited by pain   Patient Left in chair;with call bell/phone within reach;with chair alarm set   Nurse Communication Mobility status;Precautions(Return to bed in 30 mintures)        Time: 1610-9604 OT Time Calculation (min): 16 min  Charges: OT General Charges $OT Visit: 1 Visit OT Evaluation $OT Eval Moderate Complexity: 1 Mod OT Treatments $Self Care/Home Management : 8-22 mins  Smriti Barkow MSOT, OTR/L Acute Rehab Pager: [phone] Office: [phone]   Theodoro Grist Koby Pickup 07/27/2018, 1:00 PM

## 2018-07-27 NOTE — Progress Notes (Signed)
Physical Therapy Treatment Patient Details Name: Phillip Wong MRN: [id number] DOB: 1946-04-16 Today's Date: 07/27/2018    History of Present Illness Phillip Wong is a 72 y.o. male history of hyperlipidemia, hypertension, previous alcohol use here presenting with syncope, back pain.  States that he was at home and was trying to get something from a cabinet and states that he fell and hit his back causing an L1 burst fracture     PT Comments    Pt was apprehensive about getting OOB because it exacerbates pain in low back but he agreed to therapy. Pt ambulated in hospital room this session and required Mod A +2 for safety and to maintain balance. He required reminders of back precautions and cues for pursed lip breathing. After sit to stand pt c/o of slight dizziness that subsided soon after. Next session address standing balance and continue gait training with RW. Pt continues to progress toward goals and would benefit from cont'd PT in order to maximize functional independence and to be d/c to SNF.    Follow Up Recommendations  SNF     Equipment Recommendations  None recommended by PT    Recommendations for Other Services       Precautions / Restrictions Precautions Precautions: Back Precaution Booklet Issued: No Precaution Comments: Applied TLSO in supine. Educated pt on back precautions.  Required Braces or Orthoses: Spinal Brace Spinal Brace: Thoracolumbosacral orthotic;Applied in sitting position Spinal Brace Comments: Pt c/o uncomfortable pressure on abdomin when donning the TLSO.  Restrictions Weight Bearing Restrictions: No    Mobility  Bed Mobility Overal bed mobility: Needs Assistance Bed Mobility: Rolling;Sidelying to Sit Rolling: Min assist Sidelying to sit: Mod assist;+2 for physical assistance       General bed mobility comments: Assist needed to advance LE off/on EOB. Pt needed verbal reminders of percautions when going from sitting to sidelying;  requiring Mod A +2.   Transfers Overall transfer level: Needs assistance Equipment used: Rolling walker (2 wheeled) Transfers: Sit to/from Stand(bed height elevated) Sit to Stand: Mod assist         General transfer comment: Mod A to power up from EOB. Cues for hand placement.   Ambulation/Gait Ambulation/Gait assistance: +2 physical assistance;Mod assist Gait Distance (Feet): 8 Feet Assistive device: Rolling walker (2 wheeled) Gait Pattern/deviations: Antalgic;Trunk flexed;Decreased step length - left;Decreased step length - right;Narrow base of support     General Gait Details: Pt required VC/TC for safety with RW and VC for pursed lip breathing. SPO2 RA 95% during gt.   Stairs             Wheelchair Mobility    Modified Rankin (Stroke Patients Only)       Balance Overall balance assessment: Needs assistance Sitting-balance support: Bilateral upper extremity supported;Feet supported Sitting balance-Leahy Scale: Fair Sitting balance - Comments: Pt able to sit on EOB with min A to maintain balance   Standing balance support: Bilateral upper extremity supported Standing balance-Leahy Scale: Good Standing balance comment: Pt required the walker and mod assist to stand up due to increased pain. Pt had decreased standing balance due to increased pain and generalized weakness.                            Cognition Arousal/Alertness: Awake/alert Behavior During Therapy: WFL for tasks assessed/performed Overall Cognitive Status: Within Functional Limits for tasks assessed  Exercises      General Comments        Pertinent Vitals/Pain Pain Assessment: 0-10 Pain Score: 2  Pain Location: lower back  Pain Descriptors / Indicators: Sharp Pain Intervention(s): Monitored during session;Premedicated before session    Home Living                      Prior Function            PT Goals  (current goals can now be found in the care plan section) Acute Rehab PT Goals Patient Stated Goal: To go home PT Goal Formulation: With patient Time For Goal Achievement: 08/09/18 Potential to Achieve Goals: Good Progress towards PT goals: Progressing toward goals    Frequency    Min 5X/week      PT Plan Current plan remains appropriate    Co-evaluation              AM-PAC PT "6 Clicks" Daily Activity  Outcome Measure  Difficulty turning over in bed (including adjusting bedclothes, sheets and blankets)?: Unable Difficulty moving from lying on back to sitting on the side of the bed? : A Lot Difficulty sitting down on and standing up from a chair with arms (e.g., wheelchair, bedside commode, etc,.)?: A Lot Help needed moving to and from a bed to chair (including a wheelchair)?: Total Help needed walking in hospital room?: A Lot Help needed climbing 3-5 steps with a railing? : Total 6 Click Score: 9    End of Session Equipment Utilized During Treatment: Gait belt;Back brace;Other (comment)(TLSO) Activity Tolerance: Patient limited by pain Patient left: in bed;with bed alarm set;with call bell/phone within reach Nurse Communication: Mobility status PT Visit Diagnosis: Unsteadiness on feet (R26.81);Pain;Difficulty in walking, not elsewhere classified (R26.2)     Time: 9811-9147 PT Time Calculation (min) (ACUTE ONLY): 21 min  Charges:  $Gait Training: 8-22 mins                     [address] SPTA   [place] 07/27/2018, 4:37 PM

## 2018-07-27 NOTE — Evaluation (Signed)
Occupational Therapy Evaluation Patient Details Name: KEYONTA BARRADAS MRN: [id number] DOB: [birth year] Today's Date: 07/27/2018    History of Present Illness ARMANIE MARTINE is a 72 y.o. male history of hyperlipidemia, hypertension, previous alcohol use here presenting with syncope, back pain.  States that he was at home and was trying to get something from a cabinet and states that he fell and hit his back causing an L1 burst fracture    Clinical Impression   PTA, pt was living with his children and was independent. Currently, pt requires Min-Max A for UB ADLs, Mod-Max A for LB ADLs, and Min A for functional mobility using RW. Provided education on back precautions, bed mobility, brace management, LB ADLs, and toileting. Pt presenting with decreased strength, balance, and poor activity tolerance due to significant pain. Pt would benefit from further acute OT to facilitate safe dc. Recommend dc to SNF for further OT to optimize safety, independence with ADLs, and return to PLOF.      Follow Up Recommendations  SNF;Supervision/Assistance - 24 hour    Equipment Recommendations  3 in 1 bedside commode    Recommendations for Other Services PT consult     Precautions / Restrictions Precautions Precautions: Back Precaution Comments: Applied TLSO in supine. Educated pt on back precautions.  Required Braces or Orthoses: Spinal Brace Spinal Brace: Thoracolumbosacral orthotic;Applied in supine position Restrictions Weight Bearing Restrictions: No      Mobility Bed Mobility Overal bed mobility: Needs Assistance Bed Mobility: Rolling;Sidelying to Sit Rolling: Mod assist Sidelying to sit: Mod assist       General bed mobility comments: Mod A for rolling side to side. Mod A to initate BLEs over EOB and then elevate trunk  Transfers Overall transfer level: Needs assistance Equipment used: Rolling walker (2 wheeled) Transfers: Sit to/from Stand Sit to Stand: Min assist          General transfer comment: Min A to power up from EOB. Cues for hand placement    Balance Overall balance assessment: Needs assistance Sitting-balance support: Bilateral upper extremity supported;Feet supported Sitting balance-Leahy Scale: Fair     Standing balance support: Bilateral upper extremity supported Standing balance-Leahy Scale: Poor Standing balance comment: Pt required the walker and mod assist to stand up due to increased pain.                           ADL either performed or assessed with clinical judgement   ADL Overall ADL's : Needs assistance/impaired Eating/Feeding: Set up;Sitting   Grooming: Set up;Sitting   Upper Body Bathing: Minimal assistance;Sitting   Lower Body Bathing: Moderate assistance;Sit to/from stand   Upper Body Dressing : Maximal assistance;Sitting Upper Body Dressing Details (indicate cue type and reason): Max A to don back brace Lower Body Dressing: Maximal assistance;Sit to/from stand Lower Body Dressing Details (indicate cue type and reason): Pt unable to bring ankles to knees due to pain.  Toilet Transfer: Minimal assistance;RW;Ambulation Statistician Details (indicate cue type and reason): Min A for power up into standing.          Functional mobility during ADLs: Minimal assistance;Rolling walker General ADL Comments: Pt limited by significant pain. Very nervous about pain and requiring increased encouragement.     Vision Baseline Vision/History: Wears glasses Wears Glasses: Reading only Patient Visual Report: No change from baseline       Perception     Praxis      Pertinent Vitals/Pain Pain Assessment: Faces  Faces Pain Scale: Hurts even more Pain Location: lower back  Pain Descriptors / Indicators: Sharp Pain Intervention(s): Monitored during session;Repositioned     Hand Dominance Right   Extremity/Trunk Assessment Upper Extremity Assessment Upper Extremity Assessment: Overall WFL for tasks  assessed   Lower Extremity Assessment Lower Extremity Assessment: Overall WFL for tasks assessed   Cervical / Trunk Assessment Cervical / Trunk Assessment: Other exceptions Cervical / Trunk Exceptions: L1 burst fracture    Communication Communication Communication: No difficulties   Cognition Arousal/Alertness: Awake/alert Behavior During Therapy: WFL for tasks assessed/performed Overall Cognitive Status: Within Functional Limits for tasks assessed                                     General Comments  SpO2 94 on RA. Reported dizziness. BP 147/77 sitting at EOB    Exercises     Shoulder Instructions      Home Living Family/patient expects to be discharged to:: Private residence Living Arrangements: Children Available Help at Discharge: Family Type of Home: House Home Access: Stairs to enter [employer] of Steps: 5 in front and 3 in back Entrance Stairs-Rails: None Home Layout: One level     Bathroom Shower/Tub: Tub/shower unit         Home Equipment: None          Prior Functioning/Environment Level of Independence: Independent                 OT Problem List: Decreased strength;Decreased range of motion;Decreased activity tolerance;Impaired balance (sitting and/or standing);Decreased safety awareness;Decreased knowledge of use of DME or AE;Decreased knowledge of precautions;Pain      OT Treatment/Interventions: Self-care/ADL training;Therapeutic exercise;Energy conservation;DME and/or AE instruction;Therapeutic activities;Patient/family education    OT Goals(Current goals can be found in the care plan section) Acute Rehab OT Goals Patient Stated Goal: To go home OT Goal Formulation: With patient Time For Goal Achievement: 08/10/18 Potential to Achieve Goals: Good  OT Frequency: Min 2X/week   Barriers to D/C:            Co-evaluation              AM-PAC PT "6 Clicks" Daily Activity     Outcome Measure Help from  another person eating meals?: None Help from another person taking care of personal grooming?: A Little Help from another person toileting, which includes using toliet, bedpan, or urinal?: A Lot Help from another person bathing (including washing, rinsing, drying)?: A Lot Help from another person to put on and taking off regular upper body clothing?: A Lot Help from another person to put on and taking off regular lower body clothing?: A Lot 6 Click Score: 15   End of Session Equipment Utilized During Treatment: Rolling walker;Back brace(TLSO applied in sitting) Nurse Communication: Mobility status;Precautions(Return to bed in 30 mintures)  Activity Tolerance: Patient limited by pain Patient left: in chair;with call bell/phone within reach;with chair alarm set  OT Visit Diagnosis: Unsteadiness on feet (R26.81);Other abnormalities of gait and mobility (R26.89);Muscle weakness (generalized) (M62.81)                Time: 1610-9604 OT Time Calculation (min): 37 min Charges:  OT General Charges $OT Visit: 1 Visit OT Evaluation $OT Eval Moderate Complexity: 1 Mod OT Treatments $Self Care/Home Management : 8-22 mins  Ying Blankenhorn MSOT, OTR/L Acute Rehab Pager: [phone] Office: [phone]  Theodoro Grist Case [place] 07/27/2018, 12:38 PM

## 2018-07-27 NOTE — Care Management Obs Status (Signed)
MEDICARE OBSERVATION STATUS NOTIFICATION   Patient Details  Name: Phillip Wong MRN: [id number] Date of Birth: January 04, 1946   Medicare Observation Status Notification Given:  Yes    Elliot Cousin, RN 07/27/2018, 10:02 AM

## 2018-07-27 NOTE — Progress Notes (Addendum)
3:15- [facility] unable to accept patient. Awaiting response from [facility].   1:30pm-CSW spoke with patient again to let him know that he is medically stable and needs to make a SNF decision due to his insurance taking several days for authorization. He stated he wanted to talk to his daughter about it but she was at work. CSW called his daughter at work. She requested that CSW check on [facility] and [facility]. CSW asked the facilities to review the referral.   11am-Covering CSW attempted to elicit SNF choices from patient but he would not select a facility. Left son a Engineer, technical sales.   Osborne Casco Ariann Khaimov LCSW [phone]

## 2018-07-27 NOTE — Progress Notes (Signed)
Internal Medicine Attending:   I saw and examined the patient. I reviewed the resident's note and I agree with the resident's findings and plan as documented in the resident's note.  Patient feels better today with no new complaints.  His back pain is controlled on current regimen.  Patient was initially admitted to the hospital with likely syncope and back pain and found to have an acute L1 burst fracture on imaging.  Neurosurgery follow-up recommendations appreciated.  No surgical intervention required at this time.  Patient's pain is better controlled currently.  Continue with current pain regimen.  Continue with TSLO brace while ambulating.  Patient stable for discharge to SNF once bed is available.  Will need to follow-up with neurosurgery in 4 weeks with repeat x-ray.  Work-up for syncope has been negative.  It is possible that the patient had a transient arrhythmia secondary to hypokalemia which was likely medication induced from his hydrochlorothiazide.  We will continue to hold HCTZ for now.  Would consider resuming amlodipine and continue carvedilol.

## 2018-07-28 ENCOUNTER — Encounter (HOSPITAL_COMMUNITY): Payer: Self-pay

## 2018-07-28 DIAGNOSIS — S32011A Stable burst fracture of first lumbar vertebra, initial encounter for closed fracture: Secondary | ICD-10-CM | POA: Diagnosis not present

## 2018-07-28 DIAGNOSIS — W19XXXA Unspecified fall, initial encounter: Secondary | ICD-10-CM | POA: Diagnosis not present

## 2018-07-28 DIAGNOSIS — I10 Essential (primary) hypertension: Secondary | ICD-10-CM | POA: Diagnosis not present

## 2018-07-28 DIAGNOSIS — E876 Hypokalemia: Secondary | ICD-10-CM | POA: Diagnosis not present

## 2018-07-28 MED ORDER — SENNA 8.6 MG PO TABS
2.0000 | ORAL_TABLET | Freq: Two times a day (BID) | ORAL | Status: DC
  Administered 2018-07-28 – 2018-07-29 (×2): 17.2 mg via ORAL
  Filled 2018-07-28 (×2): qty 2

## 2018-07-28 MED ORDER — ADULT MULTIVITAMIN W/MINERALS CH
1.0000 | ORAL_TABLET | Freq: Every day | ORAL | Status: DC
  Administered 2018-07-28 – 2018-07-29 (×2): 1 via ORAL
  Filled 2018-07-28 (×2): qty 1

## 2018-07-28 MED ORDER — AMLODIPINE BESYLATE 5 MG PO TABS
5.0000 mg | ORAL_TABLET | Freq: Every day | ORAL | Status: DC
  Administered 2018-07-28 – 2018-07-29 (×2): 5 mg via ORAL
  Filled 2018-07-28 (×2): qty 1

## 2018-07-28 NOTE — Progress Notes (Signed)
Physical Therapy Treatment Patient Details Name: Phillip Wong MRN: [id number] DOB: 1945/11/19 Today's Date: 07/28/2018    History of Present Illness Phillip Wong is a 72 y.o. male history of hyperlipidemia, hypertension, previous alcohol use here presenting with syncope, back pain.  States that he was at home and was trying to get something from a cabinet and states that he fell and hit his back causing an L1 burst fracture     PT Comments    Good improvements today.  Emphasis on technique to decrease his pain.  Extensive education and gait training   Follow Up Recommendations  SNF     Equipment Recommendations  None recommended by PT    Recommendations for Other Services       Precautions / Restrictions Precautions Precautions: Back Required Braces or Orthoses: Spinal Brace Spinal Brace: Thoracolumbosacral orthotic;Applied in sitting position    Mobility  Bed Mobility Overal bed mobility: Needs Assistance Bed Mobility: Rolling;Sidelying to Sit Rolling: Min assist Sidelying to sit: Mod assist       General bed mobility comments: reinforced safe technique and assist from L side  Transfers Overall transfer level: Needs assistance Equipment used: Rolling walker (2 wheeled) Transfers: Sit to/from Stand Sit to Stand: Min assist         General transfer comment: cues for hand placement  Ambulation/Gait Ambulation/Gait assistance: Min assist Gait Distance (Feet): 120 Feet Assistive device: Rolling walker (2 wheeled) Gait Pattern/deviations: Step-through pattern;Decreased stride length Gait velocity: moderate Gait velocity interpretation: <1.8 ft/sec, indicate of risk for recurrent falls General Gait Details: mildly unsteady and antalgic.  Pt able to stand upright with tight brace and manuever the RW without assist   Stairs             Wheelchair Mobility    Modified Rankin (Stroke Patients Only)       Balance     Sitting balance-Leahy  Scale: Fair       Standing balance-Leahy Scale: Fair                              Cognition Arousal/Alertness: Awake/alert Behavior During Therapy: WFL for tasks assessed/performed Overall Cognitive Status: Within Functional Limits for tasks assessed                                        Exercises      General Comments General comments (skin integrity, edema, etc.): reinforced all back care/prec, log roll, donning brace, lifting restrictions and progression of activity.      Pertinent Vitals/Pain Pain Assessment: 0-10 Pain Score: 4  Pain Location: lower back  Pain Descriptors / Indicators: Sharp    Home Living                      Prior Function            PT Goals (current goals can now be found in the care plan section) Acute Rehab PT Goals Patient Stated Goal: To go home PT Goal Formulation: With patient Time For Goal Achievement: 08/09/18 Potential to Achieve Goals: Good Progress towards PT goals: Progressing toward goals    Frequency    Min 5X/week      PT Plan Current plan remains appropriate    Co-evaluation  AM-PAC PT "6 Clicks" Daily Activity  Outcome Measure  Difficulty turning over in bed (including adjusting bedclothes, sheets and blankets)?: Unable Difficulty moving from lying on back to sitting on the side of the bed? : Unable Difficulty sitting down on and standing up from a chair with arms (e.g., wheelchair, bedside commode, etc,.)?: Unable Help needed moving to and from a bed to chair (including a wheelchair)?: A Lot Help needed walking in hospital room?: A Little Help needed climbing 3-5 steps with a railing? : A Lot 6 Click Score: 10    End of Session   Activity Tolerance: Patient tolerated treatment well Patient left: in chair;with call bell/phone within reach;with family/visitor present Nurse Communication: Mobility status PT Visit Diagnosis: Unsteadiness on feet  (R26.81);Pain;Difficulty in walking, not elsewhere classified (R26.2) Pain - Right/Left: (back)     Time: 0981-1914 PT Time Calculation (min) (ACUTE ONLY): 35 min  Charges:  $Gait Training: 8-22 mins $Therapeutic Activity: 8-22 mins                     07/28/2018  Dwight Bing, PT Acute Rehabilitation Services [phone]  (pager) [phone]  (office)   Eliseo Gum Zahria Ding 07/28/2018, 5:57 PM

## 2018-07-28 NOTE — Progress Notes (Signed)
Subjective: Patient was doing okay today, no acute events overnight. He reports that his pain is controlled when he is sitting and it is at a 2/10 at the moment. He reported that he does not like the back brace, states that it's painful to put on. We discussed that it's important for his healing and that neurosurgery recommended it. He reported that he has not had a bowel movement for 2 weeks. He reported that he has not been eating a lot. And he states that he thinks it will be painful to get to the bathroom and states that he does not want to go. We discussed that it is important for him to have a bowel movement. We discussed the plan for today and he is in agreement.   Objective:  Vital signs in last 24 hours: Vitals:   07/27/18 1100 07/27/18 1300 07/27/18 2205 07/28/18 0558  BP: (!) 147/77  133/73 (!) 144/78  Pulse: 76  75 78  Resp:   18 18  Temp:  97.8 F (36.6 C) 98.4 F (36.9 C) 98.4 F (36.9 C)  TempSrc:   Oral Oral  SpO2: 94%  95% 95%  Weight:      Height:        General: Well nourished, well appearing, NAD  Cardiac: RRR, normal S1, S2, no murmurs, rubs or gallops  Pulmonary: Lungs CTA bilaterally, no wheezing, rhonchi or rales  Abdomen: Soft, non-tender, +bowel sounds, no guarding or masses noted     Assessment/Plan:  Principal Problem:   Lumbar burst fracture (HCC) Active Problems:   Hypokalemia  This is a 72 year old male with a history of CAD s/p stent in left circumflex in 2018,Nephrolithiasis s/p stents,HTN, HLD, chronic low back pain, and history of EtOH abuse who presented with a syncopal episode and fall. Hehadsignificant hip pain on admission.He was also found to be severely hypokalemic on admission down to 2.6.  Syncope:Work up has been negative, telemetry has shown no arrhythmias, echocardiogram was normal. He is on multiple blood pressure medications and he reported that he had the syncope after taking his beta blocker at home. He is on HCTZ and  amlodipine at home as well. It's possible that this could be related to his hypokalemia 2/2 HCTZ  that he had on admission causing a transient arrhythmia. -Continue telemetry -PT/OT recommended SNF placement -Continue to hold HCTZ  -Restart amlodipine -Continue carvedilol at lower dose  Hypokalemia:On admission K was 2.6. Is on lactulose and HCTZ at home and it's unclear if that is was he was taking. Replaced with oral and IV K. Today K was 3.9.Stable.  -Holding HCTZ.  Will likely discontinue on discharge.  Hypertension: Patient has had been hypertensive while admitted, up to 153/74.  -Restart amlodipine  Back pain 2/2 Lumbar compression fracture 2/2 fall:L1 burst fracture seen on CT scan.  Patient's pain is well controlled with current regimen. He has been using Percocet, OxyIR, and flexeril. He does report that he has not had a bowel movement in awhile.  -[facility] signed out, they recommended back brace, mobilization, and follow up with them in 4 weeks.  -Continue Percocet 5-356m q6 hour PRN for severe pain -Continue Oxy IR 5 mg q4 PRN -Continue lactulose, miralax -Increase senokot 2 tablets BID -PT/OT: Recommended SNF -CSW assisting with this -Back brace -Continue Flexeril   Transaminitis: History of EtOH and obesity. On admissionAST 43, ALT 52, Alk phos 65, TB 2.0. Previous CT scan showed hepatomegaly w/o ascites or obvious cirrhotic changes. -  AST, ALT, and alk phoshas been stable. -HIV and Hep panelall negative -Repeat CMP on follow up  CAD s/p stents: Continue home ASA, ticagrelor, pravastatin, and lower dose of carvedilol.  FEN:No fluids, replete lytes prn,cardiacdiet VTE ppx: Lovenox  Code Status: FULL   Dispo: Stable for discharge, pending decision on SNF   Asencion Noble, MD 07/28/2018, 6:52 AM Pager: [phone]

## 2018-07-28 NOTE — Progress Notes (Addendum)
Initial Nutrition Assessment  DOCUMENTATION CODES:   Not applicable  INTERVENTION:  -Continue Ensure Enlive BID. Each supplement provides 350 kcal and 20 grams protein.  -Counseled pt on increasing intake and liberalizing low-sodium diet  -MVI daily  NUTRITION DIAGNOSIS:   Inadequate oral intake related to limited prior education(overly strict adherance to low-sodium diet ) as evidenced by per patient/family report.  GOAL:   Patient will meet greater than or equal to 90% of their needs  MONITOR:   PO intake, Supplement acceptance, Labs, Weight trends  REASON FOR ASSESSMENT:   Malnutrition Screening Tool    ASSESSMENT:   Phillip Wong is a 72 yo male with PMH of CAD s/p stent placement, HTN, HLD, alcohol use, chronic lower back pain admitted for L1 burst fracture secondary to fall.   CT abdomen 06/17/18 shows hepatomegaly without ascites or cirrhotic changes. After admission for liver injury pt advised to adhere to low-sodium diet, per chart pt with poor intake due to limited options.   Per chart pt eating 75-100% of meals on heart healthy diet. Pending low-sodium diet  Visited pt at bedside. 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 Pt reports he was recommended a 2g sodium restriction. Pt took restriction very seriously and has become overly strict about sodium intake. Pt has become very concerned with label reading and avoidance of food which is processed or canned. Pt has started using "NoSalt" salt alternative products and asking son not to salt food that he cooks.  Note, pt's sodium level low on admission.  Pt lives with son.  Breakfast: eggs when son is home, cereal/waffles if he is alone Lunch: used to eat sandwiches, now eats nothing due to salt in lunch meat  Dinner: either son will cook or daughter will drop off food (spaghetti and meatballs, chicken pot pie). He has decreased dinner  portion due to being concerned about salt content. Pt likes to eat pasta, but concerned with salt in pasta sauce.   Pt reports he used to consume alcohol 3-4 beers daily, has stopped drinking since encounter 06/17/18 as he was advised to.   Provided pt with counseling on more realistic application of low-sodium diet. Advised him to increase his intake to get adequate nutrition and prevent wt and muscle loss, and to avoid becoming overly concerned with labels and numbers as this seems to be contributing to his fear.   Pt with 2.8 kg wt loss since 9/20 encounter. Though not significant for the time frame, pt is at risk for malnutrition in the future if he continues to restrict intake.   Pt likes Ensure so will continue to increase intake during hospitalization.   PT/OT recommending SNF placement; d/c to SNF once bed available.   Meds: folic acid, Protonix, Miralax, senna, thiamine Labs: sodium 132  NUTRITION - FOCUSED PHYSICAL EXAM:    Most Recent Value  Orbital Region  No depletion  Upper Arm Region  No depletion  Thoracic and Lumbar Region  No depletion  Buccal Region  No depletion  Temple Region  No depletion  Clavicle Bone Region  No depletion  Clavicle and Acromion Bone Region  No depletion  Scapular Bone Region  No depletion  Dorsal Hand  No depletion  Patellar Region  No depletion  Anterior Thigh Region  No depletion  Posterior Calf Region  No depletion  Edema (RD Assessment)  None  Hair  Reviewed  Eyes  Reviewed  Mouth  Reviewed  Skin  Reviewed  Nails  Reviewed       Diet Order:   Diet Order            Diet Heart Room service appropriate? Yes; Fluid consistency: Thin  Diet effective now              EDUCATION NEEDS:   Education needs have been addressed  Skin:  Skin Assessment: Reviewed RN Assessment  Last BM:  10/23  Height:   Ht Readings from Last 1 Encounters:  07/25/18 5\' 8"  (1.727 m)    Weight:   Wt Readings from Last 1 Encounters:   07/25/18 87.9 kg    Ideal Body Weight:  70 kg  BMI:  Body mass index is 29.46 kg/m.  Estimated Nutritional Needs:   Kcal:  2000-2300  Protein:  100-115 grams  Fluid:  >/=2.0 L/day    Bari Edward, Dietetic Intern [phone]

## 2018-07-28 NOTE — Progress Notes (Signed)
Internal Medicine Attending:   I saw and examined the patient. I reviewed the resident's note and I agree with the resident's findings and plan as documented in the resident's note.  Patient feels well today.  He does state that his back pain is controlled when he is sitting and laying in bed but that his back pain worsens when he wears a back brace.  Patient was initially admitted to the hospital with a syncopal episode and mechanical fall and was found to have an L1 burst fracture on imaging.  Work-up for syncope has remained negative.  Telemetry showed no evidence of arrhythmias and his echocardiogram was within normal limits.  I suspect that his syncope was likely secondary to him being on multiple blood pressure medications and possibly an arrhythmia from his potassium being 2.5 on admission.  We will continue to hold his HCTZ for now and resume his amlodipine and continue carvedilol at a lower dose.  Patient was seen by neurosurgery for his L1 fracture.  They recommended no surgical intervention for now.  We will continue with brace while ambulating as well as pain control with Percocet.  PT/OT recommending SNF placement.  Patient stable for discharge to SNF once bed is available.

## 2018-07-28 NOTE — Progress Notes (Addendum)
4pm-[facility] has started insurance authorization. Patient and grandson aware.  [facility] and [facility] are unable to accept patient. CSW updated patient's daughter. Her next choice is [facility]. CSW awaiting response.   Osborne Casco Avan Gullett LCSW [phone]

## 2018-07-29 DIAGNOSIS — Z79891 Long term (current) use of opiate analgesic: Secondary | ICD-10-CM

## 2018-07-29 DIAGNOSIS — R55 Syncope and collapse: Secondary | ICD-10-CM

## 2018-07-29 DIAGNOSIS — S32011A Stable burst fracture of first lumbar vertebra, initial encounter for closed fracture: Secondary | ICD-10-CM | POA: Diagnosis not present

## 2018-07-29 DIAGNOSIS — W19XXXA Unspecified fall, initial encounter: Secondary | ICD-10-CM | POA: Diagnosis not present

## 2018-07-29 DIAGNOSIS — R740 Nonspecific elevation of levels of transaminase and lactic acid dehydrogenase [LDH]: Secondary | ICD-10-CM | POA: Diagnosis not present

## 2018-07-29 LAB — BASIC METABOLIC PANEL
Anion gap: 6 (ref 5–15)
BUN: 16 mg/dL (ref 8–23)
CALCIUM: 8.5 mg/dL — AB (ref 8.9–10.3)
CO2: 23 mmol/L (ref 22–32)
CREATININE: 0.68 mg/dL (ref 0.61–1.24)
Chloride: 103 mmol/L (ref 98–111)
GFR calc Af Amer: >60 mL/min (ref >60)
GFR calc non Af Amer: >60 mL/min (ref >60)
GLUCOSE: 105 mg/dL — AB (ref 70–99)
Potassium: 4 mmol/L (ref 3.5–5.1)
SODIUM: 132 mmol/L — AB (ref 135–145)

## 2018-07-29 MED ORDER — HYDROCODONE-ACETAMINOPHEN 7.5-325 MG PO TABS: 1.0000 | ORAL_TABLET | ORAL | 0 refills | Status: DC | PRN

## 2018-07-29 MED ORDER — HYDROCODONE-ACETAMINOPHEN 7.5-325 MG PO TABS
1.0000 | ORAL_TABLET | ORAL | Status: DC | PRN
  Administered 2018-07-29 (×3): 1 via ORAL
  Filled 2018-07-29 (×3): qty 1

## 2018-07-29 NOTE — Progress Notes (Addendum)
Patient will discharge to [facility] Anticipated discharge date: 07/29/18 Family notified: Nicolasa Ducking, daughter (CSW left voicemail) Transportation by: [facility] - pickup scheduled for 5:30pm  Nurse to call report to [phone] and call PTAR at [phone]. Patient will go to room 211 at the facility.   CSW signing off.  Abigail Butts, LCSWA  Clinical Social Worker

## 2018-07-29 NOTE — Progress Notes (Signed)
[facility] has received insurance authorization for patient to admit today. MD aware. CSW to support with discharge.   Abigail Butts, LCSWA [phone]

## 2018-07-29 NOTE — Progress Notes (Signed)
Patient discharge and taken by PTAR. Given belongings to the patient. IV take out.

## 2018-07-29 NOTE — Progress Notes (Signed)
Gave report to RN at [facility]. 

## 2018-07-29 NOTE — Progress Notes (Addendum)
   Subjective: Patient states he did not sleep well at all last night due to back pain and noise. Previously on hydrocodone 7.5 qid and states this would help. Yesterday he ambulated in the hallway with PT and states he will do so again today and try to sit in his chair.  Denies symptoms of dizziness on standing.  Objective:  Vital signs in last 24 hours: Vitals:   07/28/18 0558 07/28/18 1641 07/28/18 2138 07/29/18 0352  BP: (!) 144/78 134/77 137/73 (!) 141/71  Pulse: 78 83 76 81  Resp: 18 18  18   Temp: 98.4 F (36.9 C) 97.6 F (36.4 C) 97.9 F (36.6 C) 97.8 F (36.6 C)  TempSrc: Oral Oral Oral Oral  SpO2: 95% 96% 96% 96%  Weight:      Height:       Physical Exam  Constitution: mild distress, appears stated age Cardio: RRR, no m/r/g MSK: no pitting edema, moving all extremities Neuro: a&o Skin: c/d/i    Assessment/Plan:  Principal Problem:   Lumbar burst fracture (HCC) Active Problems:   Hypokalemia  72 yo male with PMH CAD s/p stent 2018, Nephrolithiasis s/p stents, HTN, HLD, chronic low back pain, and EtOH abuse presenting 10/27 with syncope and fall and L1 burst compression fracture found to be hypokalemic.  L1 Compression Fracture: Increased back pain today, occurs after wearing brace.  - increase to Norco 7.5-325 q4h - reduce to home dose 7.5 qid at discharge - cont. PT/OT and wearing back brace during the day - awaiting SNF placement - follow-up with neurosurgery four weeks  Syncope: Denies symptoms of dizziness and no repeat syncope during admission with changes in BP medications  - orthostatic vital signs prior to discharge today  HTN: Cont amlodipine, coreg. Holding HCTZ due to hypokalemia at admission.   Constipation: No bowel movement in several days and refusing miralax today due to worry of making it to restroom in time. Explained   - bedside commode ordered - cont. Miralax, lactulose prn, senokot bid  Dispo: Anticipated discharge pending SNF  placement today.   Guinevere Scarlet A, DO 07/29/2018, 9:11 AM Pager: [phone]

## 2018-07-29 NOTE — Clinical Social Work Placement (Signed)
   CLINICAL SOCIAL WORK PLACEMENT  NOTE  Date:  07/29/2018  Patient Details  Name: Phillip Wong MRN: [id number] Date of Birth: 09-20-1946  Clinical Social Work is seeking post-discharge placement for this patient at the Skilled  Nursing Facility level of care (*CSW will initial, date and re-position this form in  chart as items are completed):  Yes   Patient/family provided with [facility] Clinical Social Work Department's list of facilities offering this level of care within the geographic area requested by the patient (or if unable, by the patient's family).  Yes   Patient/family informed of their freedom to choose among providers that offer the needed level of care, that participate in Medicare, Medicaid or managed care program needed by the patient, have an available bed and are willing to accept the patient.  Yes   Patient/family informed of [facility]'s ownership interest in [facility] and [facility], as well as of the fact that they are under no obligation to receive care at these facilities.  PASRR submitted to EDS on 07/26/18     PASRR number received on 07/26/18     Existing PASRR number confirmed on       FL2 transmitted to all facilities in geographic area requested by pt/family on 07/26/18     FL2 transmitted to all facilities within larger geographic area on       Patient informed that his/her managed care company has contracts with or will negotiate with certain facilities, including the following:  [facility]     Yes   Patient/family informed of bed offers received.  Patient chooses bed at [facility]     Physician recommends and patient chooses bed at      Patient to be transferred to [facility] on 07/29/18.  Patient to be transferred to facility by PTAR     Patient family notified on 07/29/18 of transfer.  Name of family member notified:        PHYSICIAN Please sign FL2     Additional Comment:     _______________________________________________ Abigail Butts, LCSW 07/29/2018, 12:26 PM

## 2018-07-29 NOTE — Progress Notes (Signed)
Physical Therapy Treatment Patient Details Name: Phillip Wong MRN: [id number] DOB: [birth year] Today's Date: 07/29/2018    History of Present Illness Phillip Wong is a 72 y.o. male history of hyperlipidemia, hypertension, previous alcohol use here presenting with syncope, back pain.  States that he was at home and was trying to get something from a cabinet and states that he fell and hit his back causing an L1 burst fracture     PT Comments    Pt was in bed upon PT arrival. Nurse stated that pain meds were just adminstered to the pt. Pt's bed mobility has improved to min guard for rolling and min A for powering up into sitting on EOB. Pt repeated percautions back to therapist.  Pt agreed to gt training this session and ambulated 128ft with RW with Min A. Pt did have to take two rest breaks while ambulating due to SOB; SPO2 93% RA. Pt tolerated tx well today and continues to progress toward stated goals. Plan remains appropriate for SNF d/c.     Follow Up Recommendations  SNF     Equipment Recommendations  None recommended by PT    Recommendations for Other Services       Precautions / Restrictions Precautions Precautions: Back Precaution Booklet Issued: No Precaution Comments: Applied TLSO in supine. Educated pt on back precautions.  Required Braces or Orthoses: Spinal Brace Spinal Brace: Thoracolumbosacral orthotic;Applied in sitting position Spinal Brace Comments: Pt c/o uncomfortable pressure on abdomin when donning the TLSO.  Restrictions Weight Bearing Restrictions: No    Mobility  Bed Mobility Overal bed mobility: Needs Assistance Bed Mobility: Rolling;Sidelying to Sit Rolling: Min guard Sidelying to sit: Min assist       General bed mobility comments: reinforced safe technique; pt required min A to power up into sitting on EOB.  Transfers Overall transfer level: Needs assistance Equipment used: Rolling walker (2 wheeled) Transfers: Sit to/from Stand Sit to  Stand: Min assist         General transfer comment: cues for hand placement and min A required to power up into standing  Ambulation/Gait Ambulation/Gait assistance: Min assist Gait Distance (Feet): 130 Feet Assistive device: Rolling walker (2 wheeled) Gait Pattern/deviations: Step-through pattern;Decreased stride length     General Gait Details: Pt required 2 standing rest breaks SPO2 93% RA. Pt able to stand upright with tight brace and manuever the RW without assist   Stairs             Wheelchair Mobility    Modified Rankin (Stroke Patients Only)       Balance Overall balance assessment: Needs assistance Sitting-balance support: Bilateral upper extremity supported;Feet supported Sitting balance-Leahy Scale: Fair Sitting balance - Comments: Pt able to sit on EOB with min guard to maintain balance   Standing balance support: Bilateral upper extremity supported Standing balance-Leahy Scale: Fair Standing balance comment: Pt required the walker and min assist to stand up due to increased pain. Upon standing pt stated he was dizzy, but subsided quickly.                            Cognition Arousal/Alertness: Awake/alert Behavior During Therapy: WFL for tasks assessed/performed Overall Cognitive Status: Within Functional Limits for tasks assessed  Exercises      General Comments        Pertinent Vitals/Pain Pain Score: 5  Pain Location: lower back  Pain Descriptors / Indicators: Sharp Pain Intervention(s): Monitored during session;Repositioned;Premedicated before session    Home Living                      Prior Function            PT Goals (current goals can now be found in the care plan section) Acute Rehab PT Goals Patient Stated Goal: To go home PT Goal Formulation: With patient Time For Goal Achievement: 08/09/18 Potential to Achieve Goals: Good Progress towards PT  goals: Progressing toward goals    Frequency    Min 5X/week      PT Plan Current plan remains appropriate    Co-evaluation              AM-PAC PT "6 Clicks" Daily Activity  Outcome Measure  Difficulty turning over in bed (including adjusting bedclothes, sheets and blankets)?: Unable Difficulty moving from lying on back to sitting on the side of the bed? : Unable Difficulty sitting down on and standing up from a chair with arms (e.g., wheelchair, bedside commode, etc,.)?: A Little Help needed moving to and from a bed to chair (including a wheelchair)?: A Little Help needed walking in hospital room?: A Little Help needed climbing 3-5 steps with a railing? : A Lot 6 Click Score: 13    End of Session Equipment Utilized During Treatment: Gait belt;Back brace;Other (comment)(TLSO) Activity Tolerance: Patient tolerated treatment well Patient left: in chair;with call bell/phone within reach;with chair alarm set Nurse Communication: Mobility status PT Visit Diagnosis: Unsteadiness on feet (R26.81);Pain;Difficulty in walking, not elsewhere classified (R26.2)     Time: 3329-5188 PT Time Calculation (min) (ACUTE ONLY): 18 min  Charges:  $Gait Training: 8-22 mins                    [address] SPTA   [place] 07/29/2018, 4:16 PM

## 2018-10-31 ENCOUNTER — Other Ambulatory Visit: Payer: Self-pay | Admitting: Cardiology

## 2018-10-31 MED ORDER — CARVEDILOL 3.125 MG PO TABS: 3.1250 mg | ORAL_TABLET | Freq: Two times a day (BID) | ORAL | 2 refills | Status: DC

## 2018-12-06 ENCOUNTER — Other Ambulatory Visit: Payer: Self-pay

## 2018-12-06 MED ORDER — CARVEDILOL 3.125 MG PO TABS: 3.1250 mg | ORAL_TABLET | Freq: Two times a day (BID) | ORAL | 2 refills | Status: DC

## 2018-12-10 ENCOUNTER — Other Ambulatory Visit: Payer: Self-pay | Admitting: Cardiology

## 2018-12-11 ENCOUNTER — Other Ambulatory Visit: Payer: Self-pay | Admitting: Cardiology

## 2018-12-11 DIAGNOSIS — I209 Angina pectoris, unspecified: Secondary | ICD-10-CM

## 2018-12-11 MED ORDER — NITROGLYCERIN 0.4 MG SL SUBL: 0.4000 mg | SUBLINGUAL_TABLET | SUBLINGUAL | 3 refills | Status: AC | PRN

## 2019-01-27 ENCOUNTER — Other Ambulatory Visit: Payer: Self-pay | Admitting: Cardiology

## 2019-01-27 NOTE — Telephone Encounter (Signed)
Please fill

## 2019-02-27 ENCOUNTER — Other Ambulatory Visit: Payer: Self-pay | Admitting: Cardiology

## 2019-02-27 NOTE — Telephone Encounter (Signed)
Please fill

## 2019-04-20 ENCOUNTER — Other Ambulatory Visit: Payer: Self-pay | Admitting: Cardiology

## 2019-06-09 ENCOUNTER — Other Ambulatory Visit: Payer: Self-pay | Admitting: Pediatrics

## 2019-06-09 ENCOUNTER — Other Ambulatory Visit (HOSPITAL_COMMUNITY): Payer: Self-pay | Admitting: Pediatrics

## 2019-06-09 ENCOUNTER — Other Ambulatory Visit: Payer: Self-pay | Admitting: Physician Assistant

## 2019-06-09 ENCOUNTER — Other Ambulatory Visit: Payer: Self-pay | Admitting: Gastroenterology

## 2019-06-09 DIAGNOSIS — R188 Other ascites: Secondary | ICD-10-CM

## 2019-06-14 ENCOUNTER — Ambulatory Visit (HOSPITAL_COMMUNITY): Payer: Medicare Other

## 2019-06-19 ENCOUNTER — Other Ambulatory Visit: Payer: Self-pay

## 2019-06-19 ENCOUNTER — Other Ambulatory Visit: Payer: Self-pay | Admitting: Physician Assistant

## 2019-06-19 ENCOUNTER — Ambulatory Visit (HOSPITAL_COMMUNITY)
Admission: RE | Admit: 2019-06-19 | Discharge: 2019-06-19 | Disposition: A | Discharge status: Home / Self Care | Payer: Medicare Other | Source: Ambulatory Visit | Attending: Physician Assistant | Admitting: Physician Assistant

## 2019-06-19 DIAGNOSIS — R188 Other ascites: Secondary | ICD-10-CM | POA: Insufficient documentation

## 2019-06-19 NOTE — Progress Notes (Signed)
Patient presented to [facility] IR today for US-guided paracentesis.  Patient's abdomen is soft, non-distended.  Limited US Abdomen demonstrates no fluid.  No procedure performed.  Images saved for review; results dictated separately.  Brynda Greathouse, MS RD PA-C 11:07 AM

## 2019-07-31 ENCOUNTER — Other Ambulatory Visit: Payer: Self-pay | Admitting: Gastroenterology

## 2019-08-12 ENCOUNTER — Other Ambulatory Visit (HOSPITAL_COMMUNITY)
Admission: RE | Admit: 2019-08-12 | Discharge: 2019-08-12 | Disposition: A | Discharge status: Home / Self Care | Payer: Medicare Other | Source: Ambulatory Visit | Attending: Gastroenterology | Admitting: Gastroenterology

## 2019-08-12 DIAGNOSIS — Z01812 Encounter for preprocedural laboratory examination: Secondary | ICD-10-CM | POA: Insufficient documentation

## 2019-08-12 DIAGNOSIS — Z20828 Contact with and (suspected) exposure to other viral communicable diseases: Secondary | ICD-10-CM | POA: Insufficient documentation

## 2019-08-13 LAB — NOVEL CORONAVIRUS, NAA (HOSP ORDER, SEND-OUT TO REF LAB; TAT 18-24 HRS): SARS-CoV-2, NAA: NOT DETECTED

## 2019-08-14 ENCOUNTER — Encounter (HOSPITAL_COMMUNITY): Payer: Self-pay | Admitting: Emergency Medicine

## 2019-08-14 ENCOUNTER — Other Ambulatory Visit: Payer: Self-pay | Admitting: Gastroenterology

## 2019-08-14 NOTE — Progress Notes (Signed)
Spoke with patient, has been able to quarantine since covid 19 testing without symptoms.  Answered patient questions.  Informed Dr. Michail Sermon would like to start procedure earlier at 0900.  Patient states he can arrive at 0800.

## 2019-08-15 ENCOUNTER — Inpatient Hospital Stay (HOSPITAL_COMMUNITY): Payer: Medicare Other

## 2019-08-15 ENCOUNTER — Emergency Department (HOSPITAL_COMMUNITY): Payer: Medicare Other

## 2019-08-15 ENCOUNTER — Inpatient Hospital Stay (HOSPITAL_COMMUNITY)
Admission: EM | Admit: 2019-08-15 | Discharge: 2019-08-17 | DRG: 206 | Disposition: A | Discharge status: Home / Self Care | Payer: Medicare Other | Attending: Physician Assistant | Admitting: Physician Assistant

## 2019-08-15 ENCOUNTER — Ambulatory Visit (HOSPITAL_COMMUNITY): Admission: RE | Admit: 2019-08-15 | Payer: Medicare Other | Source: Ambulatory Visit | Admitting: Gastroenterology

## 2019-08-15 ENCOUNTER — Encounter (HOSPITAL_COMMUNITY): Admission: EM | Disposition: A | Discharge status: Home / Self Care | Payer: Self-pay | Source: Home / Self Care

## 2019-08-15 ENCOUNTER — Other Ambulatory Visit: Payer: Self-pay

## 2019-08-15 ENCOUNTER — Encounter (HOSPITAL_COMMUNITY): Payer: Self-pay

## 2019-08-15 ENCOUNTER — Encounter (HOSPITAL_COMMUNITY): Payer: Self-pay | Admitting: Certified Registered Nurse Anesthetist

## 2019-08-15 DIAGNOSIS — G8929 Other chronic pain: Secondary | ICD-10-CM | POA: Diagnosis not present

## 2019-08-15 DIAGNOSIS — K6389 Other specified diseases of intestine: Secondary | ICD-10-CM | POA: Diagnosis not present

## 2019-08-15 DIAGNOSIS — Y92410 Unspecified street and highway as the place of occurrence of the external cause: Secondary | ICD-10-CM

## 2019-08-15 DIAGNOSIS — F410 Panic disorder [episodic paroxysmal anxiety] without agoraphobia: Secondary | ICD-10-CM | POA: Diagnosis not present

## 2019-08-15 DIAGNOSIS — M109 Gout, unspecified: Secondary | ICD-10-CM | POA: Diagnosis not present

## 2019-08-15 DIAGNOSIS — E785 Hyperlipidemia, unspecified: Secondary | ICD-10-CM | POA: Diagnosis present

## 2019-08-15 DIAGNOSIS — Z791 Long term (current) use of non-steroidal anti-inflammatories (NSAID): Secondary | ICD-10-CM

## 2019-08-15 DIAGNOSIS — K5909 Other constipation: Secondary | ICD-10-CM | POA: Diagnosis not present

## 2019-08-15 DIAGNOSIS — Z955 Presence of coronary angioplasty implant and graft: Secondary | ICD-10-CM | POA: Diagnosis not present

## 2019-08-15 DIAGNOSIS — I2510 Atherosclerotic heart disease of native coronary artery without angina pectoris: Secondary | ICD-10-CM | POA: Diagnosis not present

## 2019-08-15 DIAGNOSIS — E0590 Thyrotoxicosis, unspecified without thyrotoxic crisis or storm: Secondary | ICD-10-CM | POA: Diagnosis not present

## 2019-08-15 DIAGNOSIS — K7460 Unspecified cirrhosis of liver: Secondary | ICD-10-CM | POA: Diagnosis present

## 2019-08-15 DIAGNOSIS — Z20828 Contact with and (suspected) exposure to other viral communicable diseases: Secondary | ICD-10-CM | POA: Diagnosis not present

## 2019-08-15 DIAGNOSIS — Z79899 Other long term (current) drug therapy: Secondary | ICD-10-CM

## 2019-08-15 DIAGNOSIS — Z7951 Long term (current) use of inhaled steroids: Secondary | ICD-10-CM | POA: Diagnosis not present

## 2019-08-15 DIAGNOSIS — R0789 Other chest pain: Secondary | ICD-10-CM | POA: Diagnosis not present

## 2019-08-15 DIAGNOSIS — S2232XA Fracture of one rib, left side, initial encounter for closed fracture: Secondary | ICD-10-CM | POA: Diagnosis not present

## 2019-08-15 DIAGNOSIS — I252 Old myocardial infarction: Secondary | ICD-10-CM

## 2019-08-15 DIAGNOSIS — N400 Enlarged prostate without lower urinary tract symptoms: Secondary | ICD-10-CM | POA: Diagnosis present

## 2019-08-15 DIAGNOSIS — Z87891 Personal history of nicotine dependence: Secondary | ICD-10-CM

## 2019-08-15 DIAGNOSIS — J449 Chronic obstructive pulmonary disease, unspecified: Secondary | ICD-10-CM | POA: Diagnosis not present

## 2019-08-15 DIAGNOSIS — Z7982 Long term (current) use of aspirin: Secondary | ICD-10-CM

## 2019-08-15 DIAGNOSIS — I10 Essential (primary) hypertension: Secondary | ICD-10-CM | POA: Diagnosis present

## 2019-08-15 HISTORY — DX: Non-ST elevation (NSTEMI) myocardial infarction: I21.4

## 2019-08-15 LAB — CBC
HCT: 56.2 % — ABNORMAL HIGH (ref 39.0–52.0)
Hemoglobin: 18.8 g/dL — ABNORMAL HIGH (ref 13.0–17.0)
MCH: 30.7 pg (ref 26.0–34.0)
MCHC: 33.5 g/dL (ref 30.0–36.0)
MCV: 91.8 fL (ref 80.0–100.0)
Platelets: 244 10*3/uL (ref 150–400)
RBC: 6.12 MIL/uL — ABNORMAL HIGH (ref 4.22–5.81)
RDW: 13.7 % (ref 11.5–15.5)
WBC: 9.1 10*3/uL (ref 4.0–10.5)
nRBC: 0 % (ref 0.0–0.2)

## 2019-08-15 LAB — BASIC METABOLIC PANEL
Anion gap: 13 (ref 5–15)
BUN: 12 mg/dL (ref 8–23)
CO2: 26 mmol/L (ref 22–32)
Calcium: 9.2 mg/dL (ref 8.9–10.3)
Chloride: 96 mmol/L — ABNORMAL LOW (ref 98–111)
Creatinine, Ser: 0.75 mg/dL (ref 0.61–1.24)
GFR calc Af Amer: >60 mL/min (ref >60)
GFR calc non Af Amer: >60 mL/min (ref >60)
Glucose, Bld: 137 mg/dL — ABNORMAL HIGH (ref 70–99)
Potassium: 3.8 mmol/L (ref 3.5–5.1)
Sodium: 135 mmol/L (ref 135–145)

## 2019-08-15 LAB — HEPATIC FUNCTION PANEL
ALT: 23 U/L (ref 0–44)
AST: 24 U/L (ref 15–41)
Albumin: 4.6 g/dL (ref 3.5–5.0)
Alkaline Phosphatase: 69 U/L (ref 38–126)
Bilirubin, Direct: 0.3 mg/dL — ABNORMAL HIGH (ref 0.0–0.2)
Indirect Bilirubin: 1.6 mg/dL — ABNORMAL HIGH (ref 0.3–0.9)
Total Bilirubin: 1.9 mg/dL — ABNORMAL HIGH (ref 0.3–1.2)
Total Protein: 8.6 g/dL — ABNORMAL HIGH (ref 6.5–8.1)

## 2019-08-15 SURGERY — CANCELLED PROCEDURE

## 2019-08-15 MED ORDER — GUAIFENESIN ER 600 MG PO TB12
1200.0000 mg | ORAL_TABLET | Freq: Two times a day (BID) | ORAL | Status: DC
  Administered 2019-08-16 – 2019-08-17 (×3): 1200 mg via ORAL
  Filled 2019-08-15 (×3): qty 2

## 2019-08-15 MED ORDER — LINACLOTIDE 72 MCG PO CAPS
72.0000 ug | ORAL_CAPSULE | Freq: Every day | ORAL | Status: DC
  Administered 2019-08-16: 72 ug via ORAL
  Filled 2019-08-15: qty 1

## 2019-08-15 MED ORDER — IOHEXOL 300 MG/ML  SOLN
100.0000 mL | Freq: Once | INTRAMUSCULAR | Status: AC | PRN
  Administered 2019-08-15: 100 mL via INTRAVENOUS

## 2019-08-15 MED ORDER — MORPHINE SULFATE (PF) 4 MG/ML IV SOLN
4.0000 mg | Freq: Once | INTRAVENOUS | Status: AC
  Administered 2019-08-15: 4 mg via INTRAVENOUS
  Filled 2019-08-15: qty 1

## 2019-08-15 MED ORDER — KCL-LACTATED RINGERS-D5W 20 MEQ/L IV SOLN
INTRAVENOUS | Status: DC
  Administered 2019-08-15 – 2019-08-16 (×3): via INTRAVENOUS
  Filled 2019-08-15 (×5): qty 1000

## 2019-08-15 MED ORDER — FINASTERIDE 5 MG PO TABS
5.0000 mg | ORAL_TABLET | Freq: Every day | ORAL | Status: DC
  Administered 2019-08-16 – 2019-08-17 (×2): 5 mg via ORAL
  Filled 2019-08-15 (×2): qty 1

## 2019-08-15 MED ORDER — ONDANSETRON HCL 4 MG/2ML IJ SOLN: 4.0000 mg | Freq: Four times a day (QID) | INTRAMUSCULAR | Status: DC | PRN

## 2019-08-15 MED ORDER — LACTULOSE 10 GM/15ML PO SOLN
10.0000 g | Freq: Two times a day (BID) | ORAL | Status: DC
  Administered 2019-08-16: 10 g via ORAL
  Filled 2019-08-15 (×2): qty 15

## 2019-08-15 MED ORDER — AMITRIPTYLINE HCL 10 MG PO TABS
10.0000 mg | ORAL_TABLET | Freq: Every day | ORAL | Status: DC
  Administered 2019-08-15 – 2019-08-16 (×2): 10 mg via ORAL
  Filled 2019-08-15 (×2): qty 1

## 2019-08-15 MED ORDER — NITROGLYCERIN 0.4 MG SL SUBL: 0.4000 mg | SUBLINGUAL_TABLET | SUBLINGUAL | Status: DC | PRN

## 2019-08-15 MED ORDER — NITROFURANTOIN MACROCRYSTAL 50 MG PO CAPS
50.0000 mg | ORAL_CAPSULE | Freq: Every day | ORAL | Status: DC
  Administered 2019-08-16 – 2019-08-17 (×2): 50 mg via ORAL
  Filled 2019-08-15 (×2): qty 1

## 2019-08-15 MED ORDER — ASPIRIN EC 81 MG PO TBEC
81.0000 mg | DELAYED_RELEASE_TABLET | Freq: Every day | ORAL | Status: DC
  Administered 2019-08-16 – 2019-08-17 (×2): 81 mg via ORAL
  Filled 2019-08-15 (×2): qty 1

## 2019-08-15 MED ORDER — KETOROLAC TROMETHAMINE 30 MG/ML IJ SOLN
15.0000 mg | Freq: Four times a day (QID) | INTRAMUSCULAR | Status: DC | PRN
  Administered 2019-08-15 (×2): 15 mg via INTRAVENOUS
  Filled 2019-08-15 (×2): qty 1

## 2019-08-15 MED ORDER — DIPHENHYDRAMINE HCL 12.5 MG/5ML PO ELIX: 12.5000 mg | ORAL_SOLUTION | Freq: Four times a day (QID) | ORAL | Status: DC | PRN

## 2019-08-15 MED ORDER — ALLOPURINOL 300 MG PO TABS
300.0000 mg | ORAL_TABLET | Freq: Every morning | ORAL | Status: DC
  Administered 2019-08-16 – 2019-08-17 (×2): 300 mg via ORAL
  Filled 2019-08-15 (×2): qty 1

## 2019-08-15 MED ORDER — ONDANSETRON 4 MG PO TBDP: 4.0000 mg | ORAL_TABLET | Freq: Four times a day (QID) | ORAL | Status: DC | PRN

## 2019-08-15 MED ORDER — POLYETHYLENE GLYCOL 3350 17 G PO PACK: 17.0000 g | PACK | Freq: Every day | ORAL | Status: DC | PRN

## 2019-08-15 MED ORDER — NICOTINE 14 MG/24HR TD PT24
14.0000 mg | MEDICATED_PATCH | Freq: Every day | TRANSDERMAL | Status: DC
  Filled 2019-08-15: qty 1

## 2019-08-15 MED ORDER — CARVEDILOL 12.5 MG PO TABS
12.5000 mg | ORAL_TABLET | ORAL | Status: DC
  Administered 2019-08-16 – 2019-08-17 (×2): 12.5 mg via ORAL
  Filled 2019-08-15 (×2): qty 1

## 2019-08-15 MED ORDER — LACTULOSE ENCEPHALOPATHY 10 GM/15ML PO SOLN
10.0000 g | Freq: Two times a day (BID) | ORAL | Status: DC
  Filled 2019-08-15 (×2): qty 15

## 2019-08-15 MED ORDER — ENOXAPARIN SODIUM 40 MG/0.4ML ~~LOC~~ SOLN
40.0000 mg | SUBCUTANEOUS | Status: DC
  Administered 2019-08-16 – 2019-08-17 (×2): 40 mg via SUBCUTANEOUS
  Filled 2019-08-15 (×2): qty 0.4

## 2019-08-15 MED ORDER — SODIUM CHLORIDE (PF) 0.9 % IJ SOLN
INTRAMUSCULAR | Status: AC
  Filled 2019-08-15: qty 50

## 2019-08-15 MED ORDER — DIPHENHYDRAMINE HCL 50 MG/ML IJ SOLN: 12.5000 mg | Freq: Four times a day (QID) | INTRAMUSCULAR | Status: DC | PRN

## 2019-08-15 MED ORDER — BISACODYL 10 MG RE SUPP: 10.0000 mg | Freq: Every day | RECTAL | Status: DC | PRN

## 2019-08-15 MED ORDER — HYDROMORPHONE HCL 1 MG/ML IJ SOLN
0.5000 mg | Freq: Once | INTRAMUSCULAR | Status: AC
  Administered 2019-08-15: 0.5 mg via INTRAVENOUS
  Filled 2019-08-15: qty 1

## 2019-08-15 MED ORDER — HYDROMORPHONE HCL 1 MG/ML IJ SOLN
1.0000 mg | INTRAMUSCULAR | Status: DC | PRN
  Administered 2019-08-15 – 2019-08-16 (×6): 1 mg via INTRAVENOUS
  Filled 2019-08-15 (×6): qty 1

## 2019-08-15 MED ORDER — IOHEXOL 300 MG/ML  SOLN
75.0000 mL | Freq: Once | INTRAMUSCULAR | Status: AC | PRN
  Administered 2019-08-15: 75 mL via INTRAVENOUS

## 2019-08-15 MED ORDER — FLUTICASONE PROPIONATE 50 MCG/ACT NA SUSP
1.0000 | Freq: Every day | NASAL | Status: DC
  Administered 2019-08-16 – 2019-08-17 (×2): 1 via NASAL
  Filled 2019-08-15: qty 16

## 2019-08-15 MED ORDER — POTASSIUM CHLORIDE ER 10 MEQ PO TBCR
10.0000 meq | EXTENDED_RELEASE_TABLET | Freq: Two times a day (BID) | ORAL | Status: DC
  Administered 2019-08-15 – 2019-08-17 (×4): 10 meq via ORAL
  Filled 2019-08-15 (×8): qty 1

## 2019-08-15 MED ORDER — LIDOCAINE 5 % EX PTCH
1.0000 | MEDICATED_PATCH | CUTANEOUS | Status: DC
  Administered 2019-08-15 – 2019-08-16 (×2): 1 via TRANSDERMAL
  Filled 2019-08-15 (×2): qty 1

## 2019-08-15 MED ORDER — TAMSULOSIN HCL 0.4 MG PO CAPS
0.4000 mg | ORAL_CAPSULE | Freq: Every day | ORAL | Status: DC
  Administered 2019-08-15 – 2019-08-16 (×2): 0.4 mg via ORAL
  Filled 2019-08-15 (×2): qty 1

## 2019-08-15 MED ORDER — ACETAMINOPHEN 500 MG PO TABS
1000.0000 mg | ORAL_TABLET | Freq: Three times a day (TID) | ORAL | Status: DC
  Administered 2019-08-15 – 2019-08-17 (×5): 1000 mg via ORAL
  Filled 2019-08-15 (×5): qty 2

## 2019-08-15 MED ORDER — DICLOFENAC SODIUM 1 % TD GEL
2.0000 g | Freq: Every day | TRANSDERMAL | Status: DC | PRN
  Filled 2019-08-15: qty 100

## 2019-08-15 MED ORDER — PANTOPRAZOLE SODIUM 40 MG IV SOLR
40.0000 mg | Freq: Every day | INTRAVENOUS | Status: DC
  Administered 2019-08-15 – 2019-08-16 (×2): 40 mg via INTRAVENOUS
  Filled 2019-08-15 (×2): qty 40

## 2019-08-15 SURGICAL SUPPLY — 24 items
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
## 2019-08-15 NOTE — ED Notes (Signed)
Pt transported to Xray. 

## 2019-08-15 NOTE — ED Notes (Signed)
Patient ambulated to bathroom with no assistance.

## 2019-08-15 NOTE — ED Notes (Signed)
[facility] bedside.  

## 2019-08-15 NOTE — ED Notes (Signed)
Ambulated patient to restroom with 1 assist.
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
## 2019-08-15 NOTE — ED Notes (Signed)
Attempted IV times 2, Unsuccessful. Ronalee Belts RN to look.
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
## 2019-08-15 NOTE — ED Notes (Signed)
Pt O2 at 89%-90% RA with pt feeling "like I can't catch my breath". 2L [place] applied, pt now 96% 2L [place]

## 2019-08-15 NOTE — ED Notes (Signed)
[facility] called. 

## 2019-08-16 ENCOUNTER — Inpatient Hospital Stay (HOSPITAL_COMMUNITY): Payer: Medicare Other

## 2019-08-16 LAB — COMPREHENSIVE METABOLIC PANEL
ALT: 17 U/L (ref 0–44)
AST: 18 U/L (ref 15–41)
Albumin: 3.7 g/dL (ref 3.5–5.0)
Alkaline Phosphatase: 54 U/L (ref 38–126)
Anion gap: 10 (ref 5–15)
BUN: 15 mg/dL (ref 8–23)
CO2: 24 mmol/L (ref 22–32)
Calcium: 8.9 mg/dL (ref 8.9–10.3)
Chloride: 103 mmol/L (ref 98–111)
Creatinine, Ser: 0.77 mg/dL (ref 0.61–1.24)
GFR calc Af Amer: >60 mL/min (ref >60)
GFR calc non Af Amer: >60 mL/min (ref >60)
Glucose, Bld: 127 mg/dL — ABNORMAL HIGH (ref 70–99)
Potassium: 4.1 mmol/L (ref 3.5–5.1)
Sodium: 137 mmol/L (ref 135–145)
Total Bilirubin: 2.2 mg/dL — ABNORMAL HIGH (ref 0.3–1.2)
Total Protein: 7 g/dL (ref 6.5–8.1)

## 2019-08-16 LAB — CBC
HCT: 49.1 % (ref 39.0–52.0)
Hemoglobin: 16.9 g/dL (ref 13.0–17.0)
MCH: 31.5 pg (ref 26.0–34.0)
MCHC: 34.4 g/dL (ref 30.0–36.0)
MCV: 91.6 fL (ref 80.0–100.0)
Platelets: 199 10*3/uL (ref 150–400)
RBC: 5.36 MIL/uL (ref 4.22–5.81)
RDW: 13.8 % (ref 11.5–15.5)
WBC: 8 10*3/uL (ref 4.0–10.5)
nRBC: 0 % (ref 0.0–0.2)

## 2019-08-16 MED ORDER — TICAGRELOR 90 MG PO TABS
90.0000 mg | ORAL_TABLET | Freq: Two times a day (BID) | ORAL | Status: DC
  Administered 2019-08-16: 90 mg via ORAL
  Filled 2019-08-16 (×3): qty 1

## 2019-08-16 MED ORDER — HYDROCHLOROTHIAZIDE 12.5 MG PO CAPS
12.5000 mg | ORAL_CAPSULE | Freq: Every day | ORAL | Status: DC
  Administered 2019-08-16 – 2019-08-17 (×2): 12.5 mg via ORAL
  Filled 2019-08-16 (×2): qty 1

## 2019-08-16 MED ORDER — PROPYLTHIOURACIL 50 MG PO TABS
50.0000 mg | ORAL_TABLET | Freq: Two times a day (BID) | ORAL | Status: DC
  Administered 2019-08-16 – 2019-08-17 (×2): 50 mg via ORAL
  Filled 2019-08-16 (×3): qty 1

## 2019-08-16 MED ORDER — HYDROMORPHONE HCL 1 MG/ML IJ SOLN
0.5000 mg | Freq: Three times a day (TID) | INTRAMUSCULAR | Status: DC | PRN
  Administered 2019-08-16: 0.5 mg via INTRAVENOUS
  Filled 2019-08-16: qty 1

## 2019-08-16 MED ORDER — PRAVASTATIN SODIUM 40 MG PO TABS
40.0000 mg | ORAL_TABLET | Freq: Every morning | ORAL | Status: DC
  Administered 2019-08-17: 40 mg via ORAL
  Filled 2019-08-16: qty 1

## 2019-08-16 MED ORDER — OXYCODONE HCL 5 MG PO TABS
5.0000 mg | ORAL_TABLET | ORAL | Status: DC | PRN
  Administered 2019-08-16 – 2019-08-17 (×4): 10 mg via ORAL
  Filled 2019-08-16 (×4): qty 2

## 2019-08-16 MED ORDER — AMLODIPINE BESYLATE 5 MG PO TABS
5.0000 mg | ORAL_TABLET | Freq: Every day | ORAL | Status: DC
  Administered 2019-08-16 – 2019-08-17 (×2): 5 mg via ORAL
  Filled 2019-08-16 (×2): qty 1
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
## 2019-08-17 LAB — MAGNESIUM: Magnesium: 2 mg/dL (ref 1.7–2.4)

## 2019-08-17 LAB — BASIC METABOLIC PANEL
Anion gap: 11 (ref 5–15)
BUN: 9 mg/dL (ref 8–23)
CO2: 25 mmol/L (ref 22–32)
Calcium: 9 mg/dL (ref 8.9–10.3)
Chloride: 100 mmol/L (ref 98–111)
Creatinine, Ser: 0.84 mg/dL (ref 0.61–1.24)
GFR calc Af Amer: >60 mL/min (ref >60)
GFR calc non Af Amer: >60 mL/min (ref >60)
Glucose, Bld: 119 mg/dL — ABNORMAL HIGH (ref 70–99)
Potassium: 4 mmol/L (ref 3.5–5.1)
Sodium: 136 mmol/L (ref 135–145)

## 2019-08-17 LAB — PHOSPHORUS: Phosphorus: 2.6 mg/dL (ref 2.5–4.6)

## 2019-08-17 LAB — CBC
HCT: 48.9 % (ref 39.0–52.0)
Hemoglobin: 16.9 g/dL (ref 13.0–17.0)
MCH: 31.3 pg (ref 26.0–34.0)
MCHC: 34.6 g/dL (ref 30.0–36.0)
MCV: 90.6 fL (ref 80.0–100.0)
Platelets: 199 10*3/uL (ref 150–400)
RBC: 5.4 MIL/uL (ref 4.22–5.81)
RDW: 13.8 % (ref 11.5–15.5)
WBC: 8.5 10*3/uL (ref 4.0–10.5)
nRBC: 0 % (ref 0.0–0.2)

## 2019-08-17 MED ORDER — PANTOPRAZOLE SODIUM 40 MG PO TBEC: 40.0000 mg | DELAYED_RELEASE_TABLET | Freq: Every day | ORAL | Status: DC

## 2019-08-17 MED ORDER — GUAIFENESIN ER 600 MG PO TB12: 1200.0000 mg | ORAL_TABLET | Freq: Two times a day (BID) | ORAL | Status: DC | PRN

## 2019-08-17 MED ORDER — ACETAMINOPHEN 500 MG PO TABS: 1000.0000 mg | ORAL_TABLET | Freq: Three times a day (TID) | ORAL | 0 refills | Status: DC | PRN
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
## 2019-08-17 NOTE — Discharge Instructions (Addendum)
Discuss artery calcifications seen on CT scan with primary care physician. You may need to be referred to a cardiothoracic surgeon to discuss elective coronary revascularization options.  If you have any increased abdominal pain, nausea, vomiting, or fever please call or go to the emergency room.   Pain Medicine Instructions You may need pain medicine after an injury or illness. Two common types of pain medicine are:  Opioid pain medicine. These may be called opioids.  Non-opioid pain medicine. This includes NSAIDs. It is important to follow your doctor's instructions when you are taking pain medicine. Doing this can keep yourself and others safe. How can pain medicine affect me? Pain medicine may not make all of your pain go away. It should make you comfortable enough to:  Move.  Breathe.  Do normal activities. Opioids can cause side effects, such as:  Trouble pooping (constipation).  Feeling sick to your stomach (nausea).  Throwing up (vomiting).  Feeling very sleepy.  Confusion.  Taking the medicine for nonmedical reasons even though taking it hurts your health and well-being (opioid use disorder).  Trouble breathing (respiratory depression). Taking opioids for longer than 3 days raises your risk of these side effects. Taking opioids for a long time can affect how well you can do daily tasks. Taking them for a long time also puts you at risk for:  Car crashes.  Depression.  Suicide.  Heart attack.  Taking too much of the medicine (overdose). This can lead to death. What should I do to stay safe while taking pain medicine? Take your medicine as told  Take pain medicine exactly as told by your doctor. Take it only when you need it.  Write down the times when you take your pain medicine. Look at the times before you take your next dose.  Take other over-the-counter or prescription medicines only as told by your doctor. ? If your pain medicine has acetaminophen  in it, do not take any other acetaminophen while you are taking this medicine. Too much can damage the liver.  Get pain medicine prescriptions from only one doctor. Avoid certain activities While you are taking prescription pain medicine, and for 8 hours after your last dose:  Do not drive.  Do not use machinery.  Do not use power tools.  Do not sign legal documents.  Do not drink alcohol.  Do not take sleeping pills.  Do not take care of children by yourself.  Do not do any activities that involve climbing or being in high places.  Do not go into any body of water unless there is an adult nearby who can watch you and help you if needed. This includes: ? Lakes. ? Rivers. ? Oceans. ? Spas. ? Swimming pools.  Keep others safe  Store your medicine as told by your doctor. Keep it where children and pets cannot reach it.  Do not share your pain medicine with anyone.  Do not save any leftover pills. If you have leftover pills, you can: ? Bring them to a take-back program. ? Bring them to a pharmacy that has a drug disposal container. ? Throw them in the trash. Check the medicine label or package insert to see if it is safe to throw it out. If it is safe, take the medicine out of the container. Mix it with something that makes it unusable, such as pet waste. Then put the medicine in the trash. General instructions  Talk with your doctor about other ways to manage your pain.  If you have trouble pooping: ? Drink enough fluid to keep your pee (urine) pale yellow. ? Use a poop (stool) softener as told by your doctor. ? Eat more fruits and vegetables.  Keep all follow-up visits as told by your doctor. This is important. Contact a doctor if:  Your medicine is not helping with your pain.  You have a rash.  You feel depressed. Get help right away if: Seek medical care right away if you are taking pain medicines and you (or people close to you) notice any of the  following:  Trouble breathing.  Breathing that is shorter than normal.  Breathing that is more shallow than normal.  Confusion.  Sleepiness.  Trouble staying awake.  Feeling sick to your stomach.  Throwing up.  Your skin or lips turning pale or bluish in color.  Tongue swelling. If you ever feel like you may hurt yourself or others, or have thoughts about taking your own life, get help right away. Go to your nearest emergency department or call:  Your local emergency services (911 in the [place]  A suicide crisis helpline, such as the [place] at 212-684-5402. This is open 24 hours a day. Summary  Take your pain medicine exactly as told by your doctor.  Pain medicine can help lower your pain. It may also cause side effects.  Talk with your doctor about other ways to manage your pain.  Follow your doctor's instructions about how to take your pain medicine and keep others safe. Ask what activities you should avoid while taking pain medicine. This information is not intended to replace advice given to you by your health care provider. Make sure you discuss any questions you have with your health care provider. Document Released: 03/02/2008 Document Revised: 08/27/2017 Document Reviewed: 04/26/2017 Elsevier Patient Education  [place].     Atelectasis, Adult  Atelectasis is a collapse of air sacs in the lungs (alveoli). The condition causes all or part of a lung to collapse. Atelectasis is a common problem after surgery. Its severity depends on the size of lung tissue area involved and the underlying cause. When severe, it can lead to shortness of breath and heart problems. Atelectasis can develop suddenly or over a long period of time. Atelectasis that develops over a long period of time (chronic atelectasis) often leads to infection, scarring, and other problems. What are the causes? This condition may be caused by:  Shallow  breathing.  Medicines that make breathing more shallow.  A blockage in an airway. Blockages can result from: ? A buildup of mucus. ? A tumor. ? An inhaled object (foreign body). ? Enlarged lymph nodes. ? Fluid in the lungs (pleural effusion). ? A blood clot in the lungs.  Outside pressure on the lung. Pressure can be due to: ? A tumor. ? Fluid in the lungs (pleural effusion). ? Air leaking between the lung and rib cage (pneumothorax). ? Enlarged lymph nodes.  Improper expansion of the lungs. This may occur in newborns because of: ? Prematurity. ? Low oxygen levels. ? Secretions at birth that block the airway. ? Amniotic fluid that goes into the lungs (aspiration). What increases the risk? This condition is more likely to develop in people who:  Have an injury or health problem that makes taking deep breaths difficult or painful.  Have certain infections or diseases, such as pneumonia or cystic fibrosis.  Have had surgery on the chest or abdomen.  Have broken ribs.  Have a  tight bandage around their chest.  Have a collapsed lung due to pneumothorax.  Take medicines that decrease the rate of their breathing or how deeply they breathe, like sedatives.  Lie flat for long periods of time. What are the signs or symptoms? Often, there are no symptoms for this condition. When symptoms do appear, they may include:  Shortness of breath.  Bluish color to the nails, lips, or mouth (cyanosis).  A cough. How is this diagnosed? This condition may be diagnosed based on:  Symptoms.  A physical exam.  A chest X-ray. Sometimes specialized imaging tests are needed to diagnose the condition. How is this treated? Treatment for this condition depends on what caused the condition. Treatment may involve:  Coughing. Coughing helps loosen mucus in the airway.  Chest physiotherapy. This is a treatment to help loosen and clear mucus from the airways. It is done by clapping the  chest.  Postural drainage techniques. This treatment involves positioning your body so your head is lower than your chest. It helps mucus drain from your airways.  An incentive spirometer. This is a device that is used to help with taking deeper breaths.  Positive pressure breathing. This is a form of breathing assistance in which air is forced into the lungs when you breathe in (inhale). You may have this treatment if your condition is severe.  Treatment of the underlying condition. Follow these instructions at home:  Take over-the-counter and prescription medicines only as told by your health care provider.  Practice taking relaxed and deep breaths when you are sitting. A good time to practice is when you are watching TV. Take a few deep breaths during each commercial break.  Make sure to lie on your unaffected side when you are lying down. For example, if you have atelectasis in your left lung, lie on your right side. This will help mucus drain from your airway.  Cough several times a day as told by your health care provider.  Perform chest physiotherapy or postural drainage techniques as told by your health care provider. If necessary, have someone help you.  If you were given a device to help with breathing, use it as told by your health care provider.  Stay as active as possible. Get help right away if:  Your breathing problems get worse.  You have severe chest pain.  You develop severe coughing.  You cough up blood.  You have a fever.  You have persistent symptoms for more than 2-3 days.  Your symptoms suddenly get worse. This information is not intended to replace advice given to you by your health care provider. Make sure you discuss any questions you have with your health care provider. Document Released: 09/14/2005 Document Revised: 08/27/2017 Document Reviewed: 02/17/2016 Elsevier Patient Education  2020 [employer]
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
## 2020-06-19 ENCOUNTER — Other Ambulatory Visit: Payer: Self-pay | Admitting: Gastroenterology

## 2020-06-19 DIAGNOSIS — K7030 Alcoholic cirrhosis of liver without ascites: Secondary | ICD-10-CM

## 2020-07-02 ENCOUNTER — Ambulatory Visit
Admission: RE | Admit: 2020-07-02 | Discharge: 2020-07-02 | Disposition: A | Discharge status: Home / Self Care | Payer: Medicare Other | Source: Ambulatory Visit | Attending: Gastroenterology | Admitting: Gastroenterology

## 2020-07-02 DIAGNOSIS — K7030 Alcoholic cirrhosis of liver without ascites: Secondary | ICD-10-CM

## 2020-07-02 MED ORDER — IOPAMIDOL (ISOVUE-300) INJECTION 61%
100.0000 mL | Freq: Once | INTRAVENOUS | Status: AC | PRN
  Administered 2020-07-02: 100 mL via INTRAVENOUS

## 2020-10-08 ENCOUNTER — Other Ambulatory Visit: Payer: Self-pay | Admitting: Cardiology
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
## 2022-02-09 ENCOUNTER — Ambulatory Visit (INDEPENDENT_AMBULATORY_CARE_PROVIDER_SITE_OTHER): Payer: Medicare Other | Admitting: Physician Assistant

## 2022-02-09 ENCOUNTER — Encounter: Payer: Self-pay | Admitting: Physician Assistant

## 2022-02-09 VITALS — BP 171/72 | HR 90 | Temp 97.7°F | Ht 69.0 in | Wt 216.0 lb

## 2022-02-09 DIAGNOSIS — Z7689 Persons encountering health services in other specified circumstances: Secondary | ICD-10-CM

## 2022-02-09 DIAGNOSIS — R109 Unspecified abdominal pain: Secondary | ICD-10-CM | POA: Insufficient documentation

## 2022-02-09 DIAGNOSIS — Z860100 Personal history of colon polyps, unspecified: Secondary | ICD-10-CM | POA: Insufficient documentation

## 2022-02-09 DIAGNOSIS — R1031 Right lower quadrant pain: Secondary | ICD-10-CM | POA: Insufficient documentation

## 2022-02-09 DIAGNOSIS — K7030 Alcoholic cirrhosis of liver without ascites: Secondary | ICD-10-CM | POA: Insufficient documentation

## 2022-02-09 DIAGNOSIS — K5900 Constipation, unspecified: Secondary | ICD-10-CM | POA: Insufficient documentation

## 2022-02-09 DIAGNOSIS — I10 Essential (primary) hypertension: Secondary | ICD-10-CM

## 2022-02-09 NOTE — Patient Instructions (Signed)
Preventing Opioid Misuse ?This video will help you learn about more about opioids and how to make sure you're using them safely. ?To view the content, go to this web address: ?[email or web address].elsevier.com/[email or web address] ? ?This video will expire on: 12/16/2023. If you need access to this video following this date, please reach out to the healthcare provider who assigned it to you. ?This information is not intended to replace advice given to you by your health care provider. Make sure you discuss any questions you have with your health care provider. ?[employer]vier Patient Education ? [employer]. ? ?

## 2022-02-09 NOTE — Progress Notes (Signed)
? ?New Patient Office Visit ? ?Subjective   ? ?Patient ID: Phillip Wong, male    DOB: Jul 29, 1946  Age: 76 y.o. MRN: [id number] ? ?CC:  ?Chief Complaint  ?Patient presents with  ? New Patient (Initial Visit)  ? ? ?HPI ?Phillip Wong presents to establish care. Patient prefers to establish care with [facility]. Patient is on pain medication, discussed referral to pain management and prefers to go somewhere where he can get all medications prescribed. Reports had an appointment with [place] last week which he cancelled. States is out of his blood pressure medication. Advised to go to urgent care for med refill. ? ? No charge visit. ? ? ? ? ?Outpatient Encounter Medications as of 02/09/2022  ?Medication Sig  ? allopurinol (ZYLOPRIM) 300 MG tablet Take 300 mg by mouth every morning.   ? amLODipine (NORVASC) 5 MG tablet TAKE 1 TABLET BY MOUTH DAILY (Patient taking differently: Take 5 mg by mouth daily. )  ? aspirin EC 81 MG tablet Take 1 tablet (81 mg total) by mouth daily.  ? ENULOSE 10 GM/15ML SOLN Take 10 g by mouth 2 (two) times daily.   ? finasteride (PROSCAR) 5 MG tablet Take 5 mg by mouth daily after breakfast.   ? fluticasone (FLONASE) 50 MCG/ACT nasal spray Place 1 spray into both nostrils daily.  ? hydrochlorothiazide (MICROZIDE) 12.5 MG capsule Take 12.5 mg by mouth daily.  ? HYDROcodone-acetaminophen (NORCO) 10-325 MG tablet Take 1 tablet by mouth every 6 (six) hours as needed for moderate pain.  ? nitrofurantoin (MACRODANTIN) 50 MG capsule Take 50 mg by mouth daily.   ? nitroGLYCERIN (NITROSTAT) 0.4 MG SL tablet Place 1 tablet (0.4 mg total) under the tongue every 5 (five) minutes as needed for chest pain.  ? potassium chloride (KLOR-CON) 10 MEQ tablet Take 10 mEq by mouth 2 (two) times daily.  ? pravastatin (PRAVACHOL) 40 MG tablet Take 40 mg by mouth every morning.   ? propylthiouracil (PTU) 50 MG tablet Take 50 mg by mouth 2 (two) times daily.   ? tamsulosin (FLOMAX) 0.4 MG CAPS  capsule Take 0.4 mg by mouth at bedtime.   ? [DISCONTINUED] acetaminophen (TYLENOL) 500 MG tablet Take 2 tablets (1,000 mg total) by mouth every 8 (eight) hours as needed for mild pain. (Patient not taking: Reported on 02/09/2022)  ? [DISCONTINUED] amitriptyline (ELAVIL) 10 MG tablet Take 10 mg by mouth at bedtime.  (Patient not taking: Reported on 02/09/2022)  ? [DISCONTINUED] azithromycin (ZITHROMAX) 250 MG tablet Take 250 mg by mouth See admin instructions. Take 250 mg by mouth twice weekly on Monday and Thursday to prevent rosacea (Patient not taking: Reported on 02/09/2022)  ? [DISCONTINUED] carvedilol (COREG) 12.5 MG tablet Take 12.5 mg by mouth every morning. (Patient not taking: Reported on 02/09/2022)  ? [DISCONTINUED] diclofenac sodium (VOLTAREN) 1 % GEL Apply 2 g topically daily as needed (for leg pain).  (Patient not taking: Reported on 02/09/2022)  ? [DISCONTINUED] dicyclomine (BENTYL) 20 MG tablet Take 1 tablet (20 mg total) by mouth 2 (two) times daily as needed for spasms (abdominal cramping). (Patient not taking: Reported on 02/09/2022)  ? [DISCONTINUED] esomeprazole (NEXIUM) 20 MG capsule Take 1 capsule (20 mg total) by mouth daily. (Patient not taking: Reported on 02/09/2022)  ? [DISCONTINUED] guaiFENesin (MUCINEX) 600 MG 12 hr tablet Take 2 tablets (1,200 mg total) by mouth 2 (two) times daily as needed for cough or to loosen phlegm. (Patient not taking: Reported on 02/09/2022)  ? [  DISCONTINUED] linaclotide (LINZESS) 72 MCG capsule Take 72 mcg by mouth daily before breakfast. (Patient not taking: Reported on 02/09/2022)  ? [DISCONTINUED] polyethylene glycol (MIRALAX / GLYCOLAX) packet Take 17 g by mouth daily as needed for mild constipation or moderate constipation.  (Patient not taking: Reported on 02/09/2022)  ? [DISCONTINUED] ticagrelor (BRILINTA) 90 MG TABS tablet Take 1 tablet (90 mg total) by mouth 2 (two) times daily. (Patient not taking: Reported on 02/09/2022)  ? [DISCONTINUED] Vitamin D,  Ergocalciferol, (DRISDOL) 50000 units CAPS capsule Take 50,000 Units by mouth every Wednesday.  (Patient not taking: Reported on 02/09/2022)  ? ?No facility-administered encounter medications on file as of 02/09/2022.  ? ? ?Past Medical History:  ?Diagnosis Date  ? Arthritis   ? At risk for sleep apnea   ? STOP-BANG= 4        SENT TO PCP 11-15-2014  ? Chronic constipation   ? Chronic low back pain   ? " I BROKE MY BACK LAST YEAR 2019"  ? First degree heart block   ? Hydronephrosis, right   ? Hyperlipidemia   ? Hypertension   ? Hyperthyroidism   ? Nephrolithiasis   ? bilateral -- right is obstructive  ? Nocturia   ? NSTEMI (non-ST elevated myocardial infarction) ([place])   ? Right ureteral stone   ? Rosacea   ? Simple renal cyst   ? left  ? ? ?Past Surgical History:  ?Procedure Laterality Date  ? ABDOMINAL AORTOGRAM W/LOWER EXTREMITY N/A 12/15/2016  ? Procedure: Abdominal Aortogram w/Lower Extremity;  Surgeon: Adrian Prows, MD;  Location: [place] [facility];  Service: Cardiovascular;  Laterality: N/A;  ? CORONARY STENT INTERVENTION N/A 07/13/2017  ? Procedure: CORONARY STENT INTERVENTION;  Surgeon: Adrian Prows, MD;  Location: [place] [facility];  Service: Cardiovascular;  Laterality: N/A;  ? CYSTOSCOPY WITH RETROGRADE PYELOGRAM, URETEROSCOPY AND STENT PLACEMENT Right 03/12/2015  ? Procedure: CYSTO/RIGHT RETROGRADE PYELOGRAM/URETEROSCOPY/STONE EXTRACTION WITH BASKET AND RIGHT STENT PLACEMENT.;  Surgeon: Festus Aloe, MD;  Location: [facility];  Service: Urology;  Laterality: Right;  ? CYSTOSCOPY WITH STENT PLACEMENT Right 11/20/2014  ? Procedure: CYSTOSCOPY WITH RIGHT RETROGRADE PYLEGRAM AND RIGHT URETERAL STENT PLACEMENT;  Surgeon: Festus Aloe, MD;  Location: [facility];  Service: Urology;  Laterality: Right;  ? CYSTOSCOPY WITH URETEROSCOPY, STONE BASKETRY AND STENT PLACEMENT Right 12/18/2014  ? Procedure: CYSTOSCOPY WITH URETEROSCOPY, STONE BASKETRY AND STENT PLACEMENT;  Surgeon: Festus Aloe, MD;  Location: [facility];  Service: Urology;  Laterality: Right;  ? HOLMIUM LASER APPLICATION Right 0000000  ? Procedure: HOLMIUM LASER APPLICATION;  Surgeon: Festus Aloe, MD;  Location: [facility];  Service: Urology;  Laterality: Right;  ? LEFT HEART CATH AND CORONARY ANGIOGRAPHY N/A 07/13/2017  ? Procedure: LEFT HEART CATH AND CORONARY ANGIOGRAPHY;  Surgeon: Adrian Prows, MD;  Location: [place] [facility];  Service: Cardiovascular;  Laterality: N/A;  ? PERIPHERAL VASCULAR INTERVENTION  12/15/2016  ? Procedure: Peripheral Vascular Intervention;  Surgeon: Adrian Prows, MD;  Location: [place] [facility];  Service: Cardiovascular;;  RCIA  ? VIDEO ASSISTED THORACOSCOPY (VATS)/DECORTICATION  2013  ? pneumonia  ? ? ?Family History  ?Problem Relation Age of Onset  ? Heart disease Mother   ? ? ?Social History  ? ?Socioeconomic History  ? Marital status: Widowed  ?  Spouse name: Not on file  ? Number of children: 2  ? Years of education: Not on file  ? Highest education level: Not on file  ?Occupational History  ?  Occupation: landscaping  ?Tobacco Use  ? Smoking status: Former  ?  Packs/day: 2.00  ?  Years: 40.00  ?  Pack years: 80.00  ?  Types: Cigarettes  ?  Quit date: 09/28/1998  ?  Years since quitting: 23.3  ? Smokeless tobacco: Current  ?  Types: Snuff  ?Vaping Use  ? Vaping Use: Never used  ?Substance and Sexual Activity  ? Alcohol use: Yes  ?  Alcohol/week: 4.0 standard drinks  ?  Types: 4 Cans of beer per week  ? Drug use: No  ? Sexual activity: Never  ?  Partners: Female  ?Other Topics Concern  ? Not on file  ?Social History Narrative  ? Lives w/son, Cambron, and grandson; still works part time as a Scientist, research (medical)  ? ?Social Determinants of Health  ? ?Financial Resource Strain: Not on file  ?Food Insecurity: Not on file  ?Transportation Needs: Not on file  ?Physical Activity: Not on file  ?Stress: Not on file  ?Social Connections: Not on file  ?Intimate Partner Violence: Not on file   ? ? ?ROS ? ?  ? ? ?Objective   ? ?BP (!) 171/72   Pulse 90   Temp 97.7 ?F (36.5 ?C)   Ht 5\' 9"  (1.753 m)   Wt 216 lb (98 kg)   SpO2 92%   BMI 31.90 kg/m?  ? ?Physical Exam ? ? ?  ? ?Assessment & Plan:  ? ?Problem List

## 2022-04-09 ENCOUNTER — Ambulatory Visit: Payer: Medicare Other | Admitting: Family Medicine

## 2022-09-19 IMAGING — CT CT ABD-PELV W/ CM
2 of 5 series · 13 of 46 positions shown, 15 images · IV contrast (iopamidol)
Comparison: 08/15/2019 from [facility]

CLINICAL DATA: Alcoholic cirrhosis.

EXAM:
CT ABDOMEN AND PELVIS WITH CONTRAST
TECHNIQUE: Multidetector CT imaging of the abdomen and pelvis was performed
using the standard protocol following bolus administration of
intravenous contrast.
CONTRAST:  100mL 7907IE-MPP IOPAMIDOL (7907IE-MPP) INJECTION 61%

[Series 2: abd pelvis 5.00 br40 s3 axial · axial · 0.71mm/px · z∈[+1336,+1771]mm · 10 of 99 slices shown, 12 images]
[im 6/99  soft-tissue]
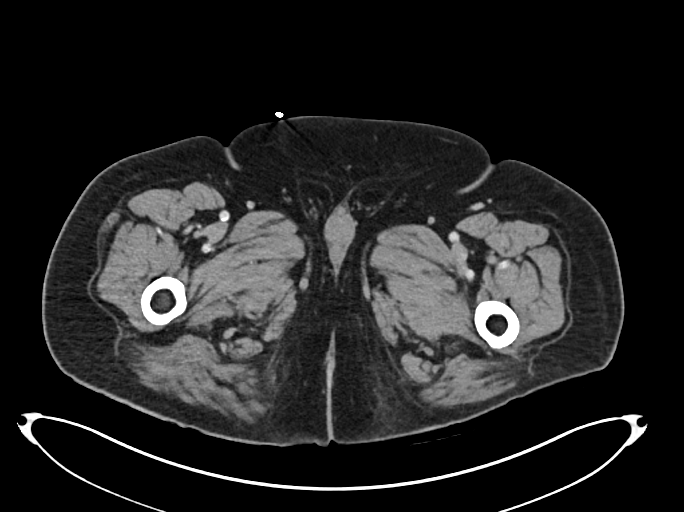
[im 6/99  bone]
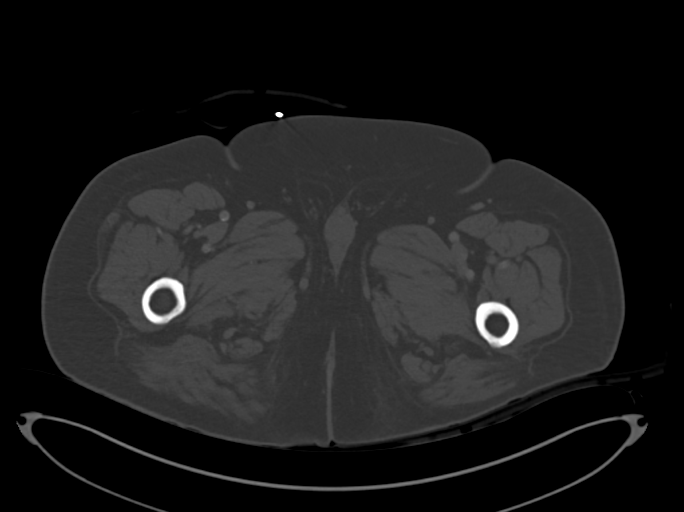
[im 17/99  soft-tissue]
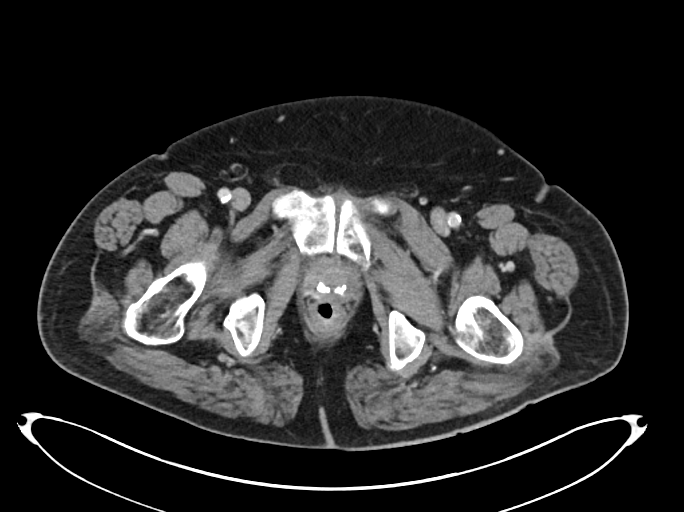
[im 28/99  soft-tissue]
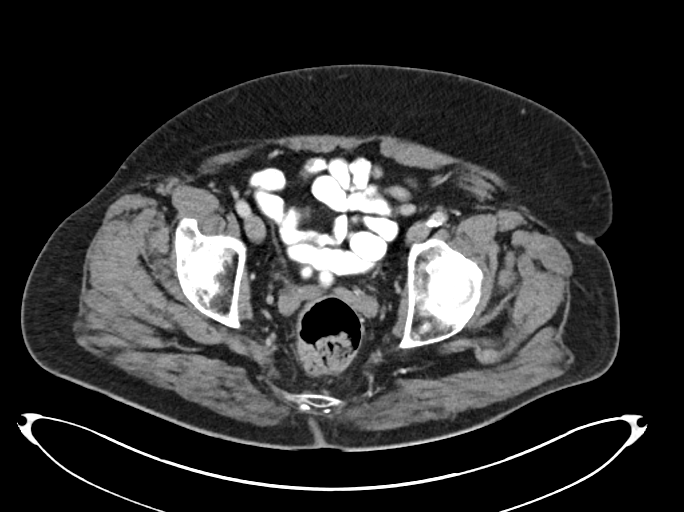
[im 33/99  soft-tissue]
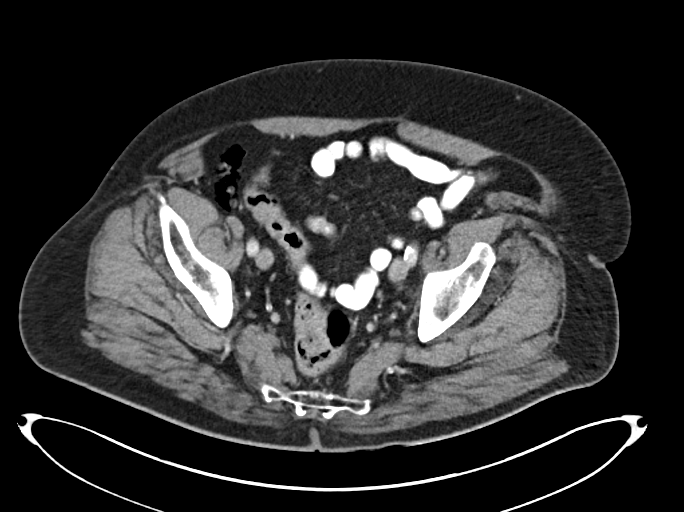
[im 44/99  soft-tissue]
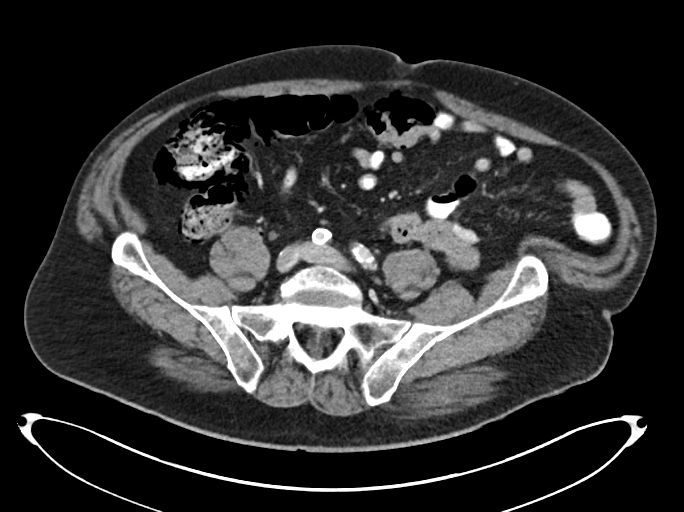
[im 55/99  soft-tissue]
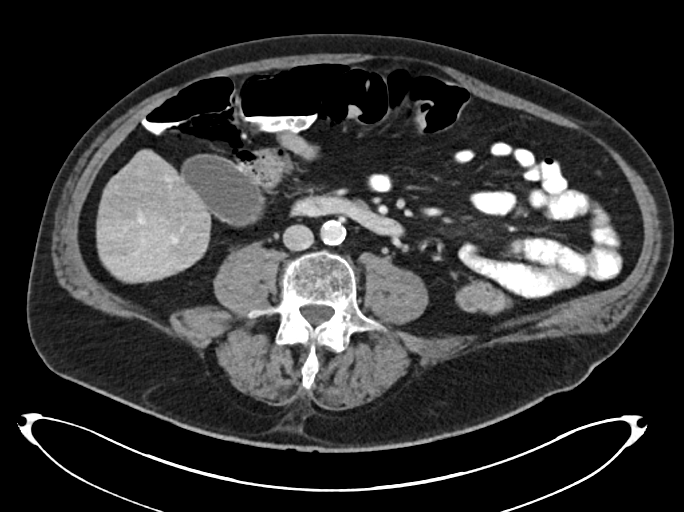
[im 66/99  soft-tissue]
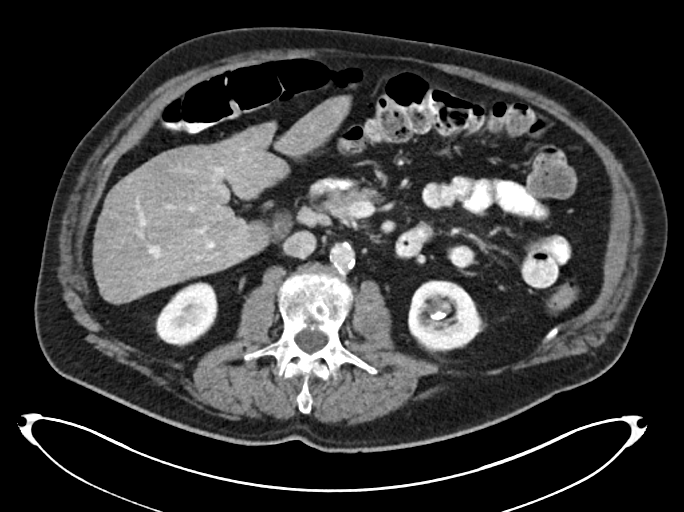
[im 71/99  soft-tissue]
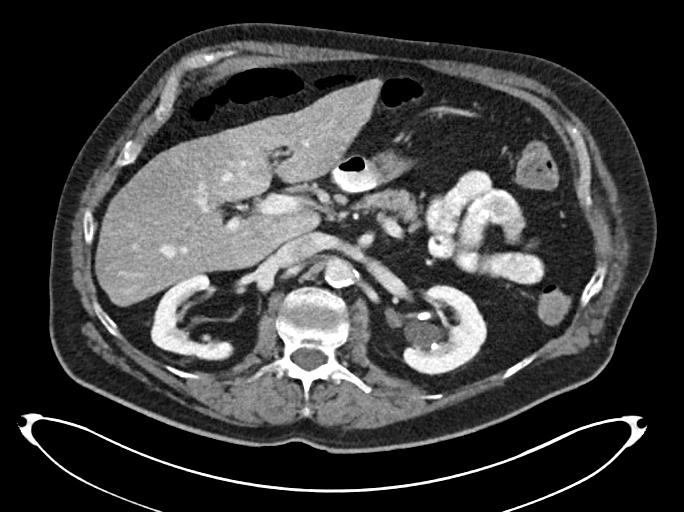
[im 82/99  soft-tissue]
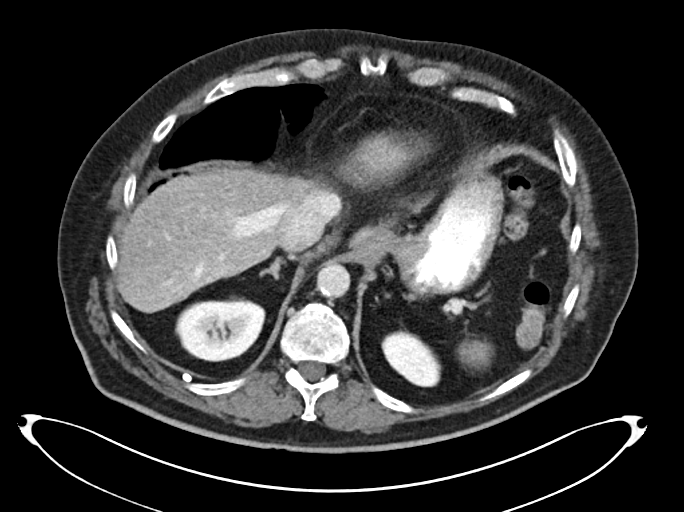
[im 82/99  bone]
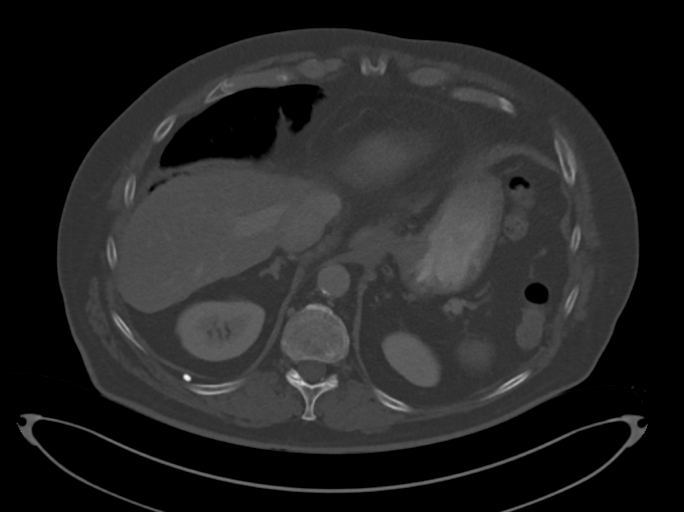
[im 93/99  soft-tissue]
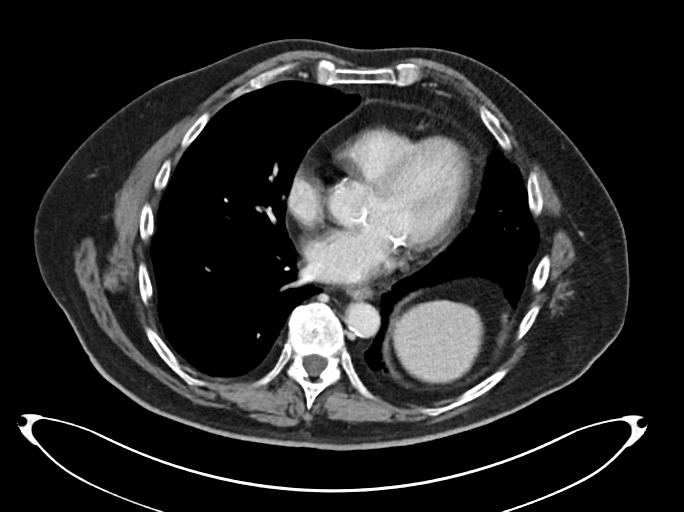

[Series 6: abd pelvis 2.00 br40 s3 cor · coronal · 0.95mm/px · 3 of 182 slices shown]
[im 61/182  soft-tissue]
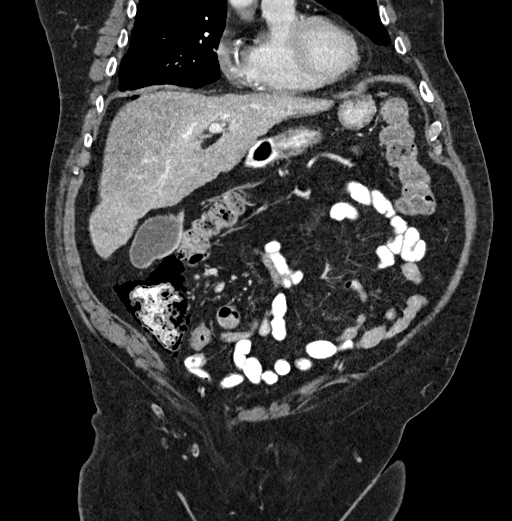
[im 81/182  soft-tissue]
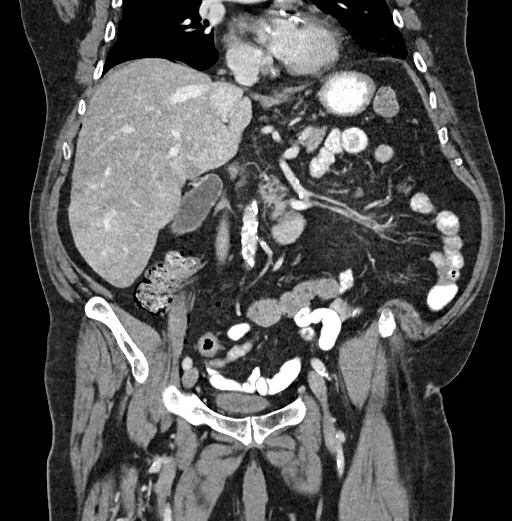
[im 101/182  soft-tissue]
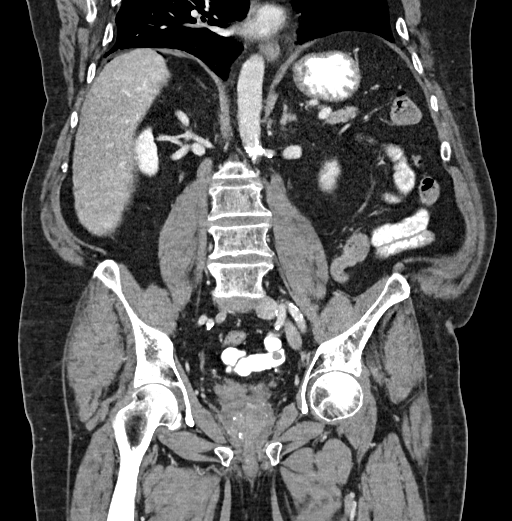

[13 of 46 positions shown; findings below may reference images not displayed]

FINDINGS: Lower Chest: No acute findings.

Hepatobiliary: Cirrhosis is again noted. No hepatic masses are
identified. Portal and hepatic veins remain patent. Gallbladder is
unremarkable. No evidence of biliary ductal dilatation.

Pancreas:  No mass or inflammatory changes.

Spleen: Within normal limits in size and appearance.

Adrenals/Urinary Tract: No masses identified. A few small left renal
sinus cysts are noted. Several calculi are again seen in the left
renal collecting system, largest measuring 17 x 9 mm. No evidence of
ureteral calculi or hydronephrosis.

Stomach/Bowel: No evidence of obstruction, inflammatory process or
abnormal fluid collections. Pneumatosis is again seen involving the
ascending and proximal transverse colon, similar to prior exam. No
evidence of portal venous gas or free intraperitoneal air.

Vascular/Lymphatic: No pathologically enlarged lymph nodes. No
abdominal aortic aneurysm. Aortic atherosclerosis noted.

Reproductive:  No mass or other significant abnormality.

Other:  None.

Musculoskeletal: No suspicious bone lesions identified. Stable old
wedge compression fracture deformity of L1 vertebral body.
IMPRESSION: Cirrhosis. No evidence of hepatic neoplasm or other acute findings.

No significant change in benign pneumatosis involving the ascending
and proximal transverse colon.

Left nephrolithiasis. No evidence of ureteral calculi or
hydronephrosis.

Aortic Atherosclerosis ([id number].G).
